# Patient Record
Sex: Male | Born: 1960 | Race: White | Hispanic: No | Marital: Married | Smoking: Former smoker
Health system: Southern US, Community
[De-identification: ages and names within clinical notes are randomized; demographics above are authoritative.]

## PROBLEM LIST (undated history)

## (undated) DIAGNOSIS — C61 Malignant neoplasm of prostate: Secondary | ICD-10-CM

## (undated) DIAGNOSIS — I10 Essential (primary) hypertension: Secondary | ICD-10-CM

## (undated) DIAGNOSIS — E119 Type 2 diabetes mellitus without complications: Secondary | ICD-10-CM

## (undated) HISTORY — DX: Essential (primary) hypertension: I10

## (undated) HISTORY — PX: PROSTATE BIOPSY: SHX241

## (undated) HISTORY — PX: TONSILLECTOMY: SUR1361

## (undated) HISTORY — DX: Type 2 diabetes mellitus without complications: E11.9

---

## 2012-06-30 ENCOUNTER — Ambulatory Visit (INDEPENDENT_AMBULATORY_CARE_PROVIDER_SITE_OTHER): Payer: BC Managed Care – PPO | Admitting: Family Medicine

## 2012-06-30 ENCOUNTER — Encounter: Payer: Self-pay | Admitting: Family Medicine

## 2012-06-30 VITALS — BP 130/80 | HR 100 | Temp 98.1°F | Resp 12 | Ht 70.0 in | Wt 235.0 lb

## 2012-06-30 DIAGNOSIS — K8020 Calculus of gallbladder without cholecystitis without obstruction: Secondary | ICD-10-CM

## 2012-06-30 DIAGNOSIS — R739 Hyperglycemia, unspecified: Secondary | ICD-10-CM | POA: Insufficient documentation

## 2012-06-30 DIAGNOSIS — R7309 Other abnormal glucose: Secondary | ICD-10-CM

## 2012-06-30 DIAGNOSIS — I10 Essential (primary) hypertension: Secondary | ICD-10-CM

## 2012-06-30 NOTE — Progress Notes (Signed)
  Subjective:    Patient ID: [name], male    DOB: March 28, 1961, 51 y.o.   MRN: [id number]  HPI  Patient is seen to establish care. History of hypertension. He just moved from [place]. Tour manager. Family will be moving out here couple weeks. Hypertension treated with Benicar HCTZ. He's also on another medication which he thinks is amlodipine but he cannot confirm. He has history of asymptomatic gallstones. No other chronic medical problems. Questionable history of previous hyperglycemia/prediabetes which apparently improved with weight loss. He's gained some weight back in recent months. No consistent exercise. No prior surgeries.  Family history significant for hyperlipidemia and hypertension both parents. Mother had type 2 diabetes and congestive heart failure in her 44s. No known coronary artery disease.  Patient is nonsmoker. Uses oral chewing tobacco. Usually one to 2 alcoholic beverages per day. Last tetanus unknown. Colonoscopy screening 2012 -reportedly had one benign polyp.   Review of Systems  Constitutional: Negative for fatigue.  Eyes: Negative for visual disturbance.  Respiratory: Negative for cough, chest tightness and shortness of breath.   Cardiovascular: Negative for chest pain, palpitations and leg swelling.  Neurological: Negative for dizziness, syncope, weakness, light-headedness and headaches.       Objective:   Physical Exam  Constitutional: He appears well-developed and well-nourished. No distress.  Neck: Neck supple. No thyromegaly present.  Cardiovascular: Normal rate and regular rhythm.   Pulmonary/Chest: Effort normal and breath sounds normal. No respiratory distress. He has no wheezes. He has no rales.  Musculoskeletal: He exhibits no edema.  Lymphadenopathy:    He has no cervical adenopathy.          Assessment & Plan:  #1 hypertension. Stable. Continue current medications. Confirm medications.  #2 reported history of hyperglycemia. Needs  followup labs and physical. We'll schedule. Discussed weight loss strategies  #3 health maintenance. Flu vaccine offered and declined. Patient will confirm last tetanus

## 2012-06-30 NOTE — Patient Instructions (Addendum)
Schedule complete physical. 

## 2012-07-16 ENCOUNTER — Other Ambulatory Visit: Payer: Self-pay | Admitting: Family Medicine

## 2012-07-16 MED ORDER — AMLODIPINE BESYLATE 10 MG PO TABS: 10.0000 mg | ORAL_TABLET | Freq: Every day | ORAL | Status: DC

## 2012-07-16 NOTE — Telephone Encounter (Signed)
Pt needs script ( he did not bring on initial visit) filled. Pt was instructed to call when he needed. The medicine is AMLODIPINE 10MG , 1 / day.  [facility]: [facility] on [address]

## 2012-07-22 ENCOUNTER — Other Ambulatory Visit: Payer: Self-pay | Admitting: *Deleted

## 2012-07-22 MED ORDER — OLMESARTAN MEDOXOMIL-HCTZ 40-25 MG PO TABS: 1.0000 | ORAL_TABLET | Freq: Every day | ORAL | Status: DC

## 2012-08-01 ENCOUNTER — Other Ambulatory Visit: Payer: Self-pay | Admitting: *Deleted

## 2012-08-01 MED ORDER — OLMESARTAN MEDOXOMIL-HCTZ 40-25 MG PO TABS: 1.0000 | ORAL_TABLET | Freq: Every day | ORAL | Status: DC

## 2012-08-27 DIAGNOSIS — E119 Type 2 diabetes mellitus without complications: Secondary | ICD-10-CM

## 2012-08-27 HISTORY — DX: Type 2 diabetes mellitus without complications: E11.9

## 2012-09-01 ENCOUNTER — Telehealth: Payer: Self-pay | Admitting: Family Medicine

## 2012-09-01 NOTE — Telephone Encounter (Signed)
RN attempted to contact patient, left a voice mail.   ° °

## 2012-09-04 ENCOUNTER — Ambulatory Visit (INDEPENDENT_AMBULATORY_CARE_PROVIDER_SITE_OTHER): Payer: BC Managed Care – PPO | Admitting: Family Medicine

## 2012-09-04 ENCOUNTER — Encounter: Payer: Self-pay | Admitting: Family Medicine

## 2012-09-04 VITALS — BP 122/82 | Temp 98.8°F | Wt 227.0 lb

## 2012-09-04 DIAGNOSIS — H538 Other visual disturbances: Secondary | ICD-10-CM

## 2012-09-04 DIAGNOSIS — I10 Essential (primary) hypertension: Secondary | ICD-10-CM

## 2012-09-04 DIAGNOSIS — E119 Type 2 diabetes mellitus without complications: Secondary | ICD-10-CM

## 2012-09-04 DIAGNOSIS — E1165 Type 2 diabetes mellitus with hyperglycemia: Secondary | ICD-10-CM | POA: Insufficient documentation

## 2012-09-04 LAB — BASIC METABOLIC PANEL
CO2: 27 mEq/L (ref 19–32)
Chloride: 92 mEq/L — ABNORMAL LOW (ref 96–112)
Sodium: 130 mEq/L — ABNORMAL LOW (ref 135–145)

## 2012-09-04 LAB — LIPID PANEL
Cholesterol: 286 mg/dL — ABNORMAL HIGH (ref 0–200)
HDL: 20 mg/dL — ABNORMAL LOW (ref >39.00)
Total CHOL/HDL Ratio: 14
Triglycerides: 1850 mg/dL — ABNORMAL HIGH (ref 0.0–149.0)

## 2012-09-04 LAB — HEMOGLOBIN A1C: Hgb A1c MFr Bld: 9.1 % — ABNORMAL HIGH (ref 4.6–6.5)

## 2012-09-04 LAB — HEPATIC FUNCTION PANEL
ALT: 30 U/L (ref 0–53)
Albumin: 4.4 g/dL (ref 3.5–5.2)
Total Bilirubin: 1.4 mg/dL — ABNORMAL HIGH (ref 0.3–1.2)

## 2012-09-04 LAB — GLUCOSE, POCT (MANUAL RESULT ENTRY): POC Glucose: 352 mg/dl — AB (ref 70–99)

## 2012-09-04 MED ORDER — METFORMIN HCL 500 MG PO TABS: 500.0000 mg | ORAL_TABLET | Freq: Two times a day (BID) | ORAL | Status: DC

## 2012-09-04 NOTE — Progress Notes (Signed)
  Subjective:    Patient ID: [name], male    DOB: Sep 04, 1960, 52 y.o.   MRN: [id number]  HPI  Progressive symptoms of urine frequency, increased thirst, blurred vision bilaterally, and some weight loss over the past couple of weeks. Patient has prior history of prediabetes. He states in the past this was controlled with weight loss. He had some increased job stress and other stressors recently. He has never monitored his blood sugars. He is 8 pound weight loss since last visit.  Patient has hypertension treated with amlodipine and Benicar HCTZ. Blood pressures have generally been well controlled. Patient denies recent chest pains. No dizziness. No dyspnea.  Past Medical History  Diagnosis Date  . Hypertension    No past surgical history on file.  reports that he quit smoking about 28 years ago. His smoking use included Cigarettes. He has a 10 pack-year smoking history. His smokeless tobacco use includes Chew. His alcohol and drug histories not on file. family history includes Aneurysm in his father; Diabetes in his mother; Heart disease (age of onset:67) in his mother; Hyperlipidemia in his father; and Hypertension in his father. No Known Allergies   Review of Systems  Constitutional: Positive for unexpected weight change. Negative for appetite change.  Respiratory: Negative for cough and shortness of breath.   Cardiovascular: Negative for chest pain.  Gastrointestinal: Negative for abdominal pain.  Genitourinary: Positive for frequency.       Objective:   Physical Exam  Constitutional: He appears well-developed and well-nourished.  Cardiovascular: Normal rate and regular rhythm.   Pulmonary/Chest: Effort normal and breath sounds normal. No respiratory distress. He has no wheezes. He has no rales.  Musculoskeletal: He exhibits no edema.          Assessment & Plan:  Type 2 diabetes- New-onset. Fasting blood sugar today 352. In this patient with prior history of Glucose  intolerance we've recommended going ahead and starting treatment given classic symptoms and magnitude of elevated blood sugar today. Home glucose monitor given. We've recommended diabetic education and he wishes to wait until followup visit in 2 weeks. Start metformin 500 mg twice daily. Dietary factors discussed.  Get some baseline labs with lipid panel, basic metabolic panel, hepatic panel, and hemoglobin A1c  Hypertension. Adequately controlled

## 2012-09-04 NOTE — Patient Instructions (Signed)

## 2012-09-09 ENCOUNTER — Other Ambulatory Visit: Payer: Self-pay | Admitting: *Deleted

## 2012-09-09 DIAGNOSIS — E785 Hyperlipidemia, unspecified: Secondary | ICD-10-CM

## 2012-09-09 MED ORDER — FENOFIBRATE 145 MG PO TABS: 145.0000 mg | ORAL_TABLET | Freq: Every day | ORAL | Status: DC

## 2012-09-09 NOTE — Progress Notes (Signed)
Quick Note:  Pt informed on personally identified VM, will also mail labs to pt home with instructions ______

## 2012-09-18 ENCOUNTER — Ambulatory Visit (INDEPENDENT_AMBULATORY_CARE_PROVIDER_SITE_OTHER): Payer: BC Managed Care – PPO | Admitting: Family Medicine

## 2012-09-18 ENCOUNTER — Encounter: Payer: Self-pay | Admitting: Family Medicine

## 2012-09-18 VITALS — BP 98/68 | Temp 98.6°F | Wt 226.0 lb

## 2012-09-18 DIAGNOSIS — E785 Hyperlipidemia, unspecified: Secondary | ICD-10-CM

## 2012-09-18 DIAGNOSIS — I10 Essential (primary) hypertension: Secondary | ICD-10-CM

## 2012-09-18 DIAGNOSIS — E119 Type 2 diabetes mellitus without complications: Secondary | ICD-10-CM

## 2012-09-18 DIAGNOSIS — E871 Hypo-osmolality and hyponatremia: Secondary | ICD-10-CM

## 2012-09-18 MED ORDER — METFORMIN HCL 500 MG PO TABS: ORAL_TABLET | ORAL | Status: DC

## 2012-09-18 NOTE — Progress Notes (Signed)
  Subjective:    Patient ID: [name], male    DOB: May 18, 1961, 52 y.o.   MRN: [id number]  HPI Followup newly diagnosed type 2 diabetes. Patient presented with classic symptoms of increased urinary frequency, thirst, and weight loss. We started metformin 500 mg twice daily. Blurred vision has resolved. Thirst and urine frequency are greatly improved. Weight has stabilized. Blood sugars fasting have been ranging still high around 250. 352 2 weeks ago before starting medication.  Blood pressure been stable. No orthostasis. Recent lab significant for A1c 9.1% prior starting treatment and severely elevated triglycerides of >1800. We started fenofibrate 145 mg daily which he is tolerating well with no side effects.  No abdominal pain. No nausea or vomiting. No history of pancreatitis. Overall feels improved  Past Medical History  Diagnosis Date  . Hypertension   . Diabetes mellitus without complication 1/14    type 2   No past surgical history on file.  reports that he quit smoking about 28 years ago. His smoking use included Cigarettes. He has a 10 pack-year smoking history. His smokeless tobacco use includes Chew. His alcohol and drug histories not on file. family history includes Aneurysm in his father; Diabetes in his mother; Heart disease (age of onset:67) in his mother; Hyperlipidemia in his father; and Hypertension in his father. No Known Allergies    Review of Systems  Constitutional: Negative for fatigue.  Eyes: Negative for visual disturbance.  Respiratory: Negative for cough, chest tightness and shortness of breath.   Cardiovascular: Negative for chest pain, palpitations and leg swelling.  Gastrointestinal: Negative for nausea and vomiting.  Neurological: Negative for dizziness, syncope, weakness, light-headedness and headaches.       Objective:   Physical Exam  Constitutional: He appears well-developed and well-nourished.  HENT:  Mouth/Throat: Oropharynx is clear and  moist.  Neck: Neck supple.  Cardiovascular: Normal rate and regular rhythm.   Pulmonary/Chest: Effort normal and breath sounds normal. No respiratory distress. He has no wheezes. He has no rales.  Musculoskeletal: He exhibits no edema.          Assessment & Plan:  #1 type 2 diabetes. Improved control but still poor control by home readings. Titrate metformin to 1000 mg twice daily. Continue to monitor at home. Pt agrees to setting up appt with diabetes educator. #2 severe dyslipidemia. Order future labs for fasting lipid panel in approximately 6 weeks. Tolerating Fenofibrate without side effects. #3 hypertension. Well controlled

## 2012-09-18 NOTE — Patient Instructions (Signed)
Hypertriglyceridemia  Diet for High blood levels of Triglycerides Most fats in food are triglycerides. Triglycerides in your blood are stored as fat in your body. High levels of triglycerides in your blood may put you at a greater risk for heart disease and stroke.  Normal triglyceride levels are less than 150 mg/dL. Borderline high levels are 150-199 mg/dl. High levels are 200 - 499 mg/dL, and very high triglyceride levels are greater than 500 mg/dL. The decision to treat high triglycerides is generally based on the level. For people with borderline or high triglyceride levels, treatment includes weight loss and exercise. Drugs are recommended for people with very high triglyceride levels. Many people who need treatment for high triglyceride levels have metabolic syndrome. This syndrome is a collection of disorders that often include: insulin resistance, high blood pressure, blood clotting problems, high cholesterol and triglycerides. TESTING PROCEDURE FOR TRIGLYCERIDES  You should not eat 4 hours before getting your triglycerides measured. The normal range of triglycerides is between 10 and 250 milligrams per deciliter (mg/dl). Some people may have extreme levels (1000 or above), but your triglyceride level may be too high if it is above 150 mg/dl, depending on what other risk factors you have for heart disease.  People with high blood triglycerides may also have high blood cholesterol levels. If you have high blood cholesterol as well as high blood triglycerides, your risk for heart disease is probably greater than if you only had high triglycerides. High blood cholesterol is one of the main risk factors for heart disease. CHANGING YOUR DIET  Your weight can affect your blood triglyceride level. If you are more than 20% above your ideal body weight, you may be able to lower your blood triglycerides by losing weight. Eating less and exercising regularly is the best way to combat this. Fat provides more  calories than any other food. The best way to lose weight is to eat less fat. Only 30% of your total calories should come from fat. Less than 7% of your diet should come from saturated fat. A diet low in fat and saturated fat is the same as a diet to decrease blood cholesterol. By eating a diet lower in fat, you may lose weight, lower your blood cholesterol, and lower your blood triglyceride level.  Eating a diet low in fat, especially saturated fat, may also help you lower your blood triglyceride level. Ask your dietitian to help you figure how much fat you can eat based on the number of calories your caregiver has prescribed for you.  Exercise, in addition to helping with weight loss may also help lower triglyceride levels.   Alcohol can increase blood triglycerides. You may need to stop drinking alcoholic beverages.  Too much carbohydrate in your diet may also increase your blood triglycerides. Some complex carbohydrates are necessary in your diet. These may include bread, rice, potatoes, other starchy vegetables and cereals.  Reduce "simple" carbohydrates. These may include pure sugars, candy, honey, and jelly without losing other nutrients. If you have the kind of high blood triglycerides that is affected by the amount of carbohydrates in your diet, you will need to eat less sugar and less high-sugar foods. Your caregiver can help you with this.  Adding 2-4 grams of fish oil (EPA+ DHA) may also help lower triglycerides. Speak with your caregiver before adding any supplements to your regimen. Following the Diet  Maintain your ideal weight. Your caregivers can help you with a diet. Generally, eating less food and getting more   exercise will help you lose weight. Joining a weight control group may also help. Ask your caregivers for a good weight control group in your area.  Eat low-fat foods instead of high-fat foods. This can help you lose weight too.  These foods are lower in fat. Eat MORE of these:    Dried beans, peas, and lentils.  Egg whites.  Low-fat cottage cheese.  Fish.  Lean cuts of meat, such as round, sirloin, rump, and flank (cut extra fat off meat you fix).  Whole grain breads, cereals and pasta.  Skim and nonfat dry milk.  Low-fat yogurt.  Poultry without the skin.  Cheese made with skim or part-skim milk, such as mozzarella, parmesan, farmers', ricotta, or pot cheese. These are higher fat foods. Eat LESS of these:   Whole milk and foods made from whole milk, such as American, blue, cheddar, monterey jack, and swiss cheese  High-fat meats, such as luncheon meats, sausages, knockwurst, bratwurst, hot dogs, ribs, corned beef, ground pork, and regular ground beef.  Fried foods. Limit saturated fats in your diet. Substituting unsaturated fat for saturated fat may decrease your blood triglyceride level. You will need to read package labels to know which products contain saturated fats.  These foods are high in saturated fat. Eat LESS of these:   Fried pork skins.  Whole milk.  Skin and fat from poultry.  Palm oil.  Butter.  Shortening.  Cream cheese.  Bacon.  Margarines and baked goods made from listed oils.  Vegetable shortenings.  Chitterlings.  Fat from meats.  Coconut oil.  Palm kernel oil.  Lard.  Cream.  Sour cream.  Fatback.  Coffee whiteners and non-dairy creamers made with these oils.  Cheese made from whole milk. Use unsaturated fats (both polyunsaturated and monounsaturated) moderately. Remember, even though unsaturated fats are better than saturated fats; you still want a diet low in total fat.  These foods are high in unsaturated fat:   Canola oil.  Sunflower oil.  Mayonnaise.  Almonds.  Peanuts.  Pine nuts.  Margarines made with these oils.  Safflower oil.  Olive oil.  Avocados.  Cashews.  Peanut butter.  Sunflower seeds.  Soybean oil.  Peanut  oil.  Olives.  Pecans.  Walnuts.  Pumpkin seeds. Avoid sugar and other high-sugar foods. This will decrease carbohydrates without decreasing other nutrients. Sugar in your food goes rapidly to your blood. When there is excess sugar in your blood, your liver may use it to make more triglycerides. Sugar also contains calories without other important nutrients.  Eat LESS of these:   Sugar, brown sugar, powdered sugar, jam, jelly, preserves, honey, syrup, molasses, pies, candy, cakes, cookies, frosting, pastries, colas, soft drinks, punches, fruit drinks, and regular gelatin.  Avoid alcohol. Alcohol, even more than sugar, may increase blood triglycerides. In addition, alcohol is high in calories and low in nutrients. Ask for sparkling water, or a diet soft drink instead of an alcoholic beverage. Suggestions for planning and preparing meals   Bake, broil, grill or roast meats instead of frying.  Remove fat from meats and skin from poultry before cooking.  Add spices, herbs, lemon juice or vinegar to vegetables instead of salt, rich sauces or gravies.  Use a non-stick skillet without fat or use no-stick sprays.  Cool and refrigerate stews and broth. Then remove the hardened fat floating on the surface before serving.  Refrigerate meat drippings and skim off fat to make low-fat gravies.  Serve more fish.  Use less butter,   margarine and other high-fat spreads on bread or vegetables.  Use skim or reconstituted non-fat dry milk for cooking.  Cook with low-fat cheeses.  Substitute low-fat yogurt or cottage cheese for all or part of the sour cream in recipes for sauces, dips or congealed salads.  Use half yogurt/half mayonnaise in salad recipes.  Substitute evaporated skim milk for cream. Evaporated skim milk or reconstituted non-fat dry milk can be whipped and substituted for whipped cream in certain recipes.  Choose fresh fruits for dessert instead of high-fat foods such as pies or  cakes. Fruits are naturally low in fat. When Dining Out   Order low-fat appetizers such as fruit or vegetable juice, pasta with vegetables or tomato sauce.  Select clear, rather than cream soups.  Ask that dressings and gravies be served on the side. Then use less of them.  Order foods that are baked, broiled, poached, steamed, stir-fried, or roasted.  Ask for margarine instead of butter, and use only a small amount.  Drink sparkling water, unsweetened tea or coffee, or diet soft drinks instead of alcohol or other sweet beverages. QUESTIONS AND ANSWERS ABOUT OTHER FATS IN THE BLOOD: SATURATED FAT, TRANS FAT, AND CHOLESTEROL What is trans fat? Trans fat is a type of fat that is formed when vegetable oil is hardened through a process called hydrogenation. This process helps makes foods more solid, gives them shape, and prolongs their shelf life. Trans fats are also called hydrogenated or partially hydrogenated oils.  What do saturated fat, trans fat, and cholesterol in foods have to do with heart disease? Saturated fat, trans fat, and cholesterol in the diet all raise the level of LDL "bad" cholesterol in the blood. The higher the LDL cholesterol, the greater the risk for coronary heart disease (CHD). Saturated fat and trans fat raise LDL similarly.  What foods contain saturated fat, trans fat, and cholesterol? High amounts of saturated fat are found in animal products, such as fatty cuts of meat, chicken skin, and full-fat dairy products like butter, whole milk, cream, and cheese, and in tropical vegetable oils such as palm, palm kernel, and coconut oil. Trans fat is found in some of the same foods as saturated fat, such as vegetable shortening, some margarines (especially hard or stick margarine), crackers, cookies, baked goods, fried foods, salad dressings, and other processed foods made with partially hydrogenated vegetable oils. Small amounts of trans fat also occur naturally in some animal  products, such as milk products, beef, and lamb. Foods high in cholesterol include liver, other organ meats, egg yolks, shrimp, and full-fat dairy products. How can I use the new food label to make heart-healthy food choices? Check the Nutrition Facts panel of the food label. Choose foods lower in saturated fat, trans fat, and cholesterol. For saturated fat and cholesterol, you can also use the Percent Daily Value (%DV): 5% DV or less is low, and 20% DV or more is high. (There is no %DV for trans fat.) Use the Nutrition Facts panel to choose foods low in saturated fat and cholesterol, and if the trans fat is not listed, read the ingredients and limit products that list shortening or hydrogenated or partially hydrogenated vegetable oil, which tend to be high in trans fat. POINTS TO REMEMBER:   Discuss your risk for heart disease with your caregivers, and take steps to reduce risk factors.  Change your diet. Choose foods that are low in saturated fat, trans fat, and cholesterol.  Add exercise to your daily routine if   it is not already being done. Participate in physical activity of moderate intensity, like brisk walking, for at least 30 minutes on most, and preferably all days of the week. No time? Break the 30 minutes into three, 10-minute segments during the day.  Stop smoking. If you do smoke, contact your caregiver to discuss ways in which they can help you quit.  Do not use street drugs.  Maintain a normal weight.  Maintain a healthy blood pressure.  Keep up with your blood work for checking the fats in your blood as directed by your caregiver. Document Released: 05/31/2004 Document Revised: 02/12/2012 Document Reviewed: 12/27/2008 [facility] Patient Information 2013 [place], [place].  Go ahead and increase to metformin 500 mg to 2 tablets twice daily

## 2012-11-06 ENCOUNTER — Other Ambulatory Visit: Payer: BC Managed Care – PPO

## 2012-11-13 ENCOUNTER — Ambulatory Visit: Payer: BC Managed Care – PPO | Admitting: Family Medicine

## 2012-11-20 ENCOUNTER — Other Ambulatory Visit: Payer: BC Managed Care – PPO

## 2012-11-25 ENCOUNTER — Other Ambulatory Visit (INDEPENDENT_AMBULATORY_CARE_PROVIDER_SITE_OTHER): Payer: BC Managed Care – PPO

## 2012-11-25 DIAGNOSIS — E785 Hyperlipidemia, unspecified: Secondary | ICD-10-CM

## 2012-11-25 DIAGNOSIS — E871 Hypo-osmolality and hyponatremia: Secondary | ICD-10-CM

## 2012-11-25 LAB — LIPID PANEL
Cholesterol: 152 mg/dL (ref 0–200)
HDL: 36 mg/dL — ABNORMAL LOW (ref >39.00)
LDL Cholesterol: 97 mg/dL (ref 0–99)
Total CHOL/HDL Ratio: 4
Triglycerides: 95 mg/dL (ref 0.0–149.0)
VLDL: 19 mg/dL (ref 0.0–40.0)

## 2012-11-25 LAB — HEPATIC FUNCTION PANEL
AST: 24 U/L (ref 0–37)
Albumin: 4.4 g/dL (ref 3.5–5.2)
Alkaline Phosphatase: 27 U/L — ABNORMAL LOW (ref 39–117)
Total Bilirubin: 0.8 mg/dL (ref 0.3–1.2)

## 2012-11-25 LAB — BASIC METABOLIC PANEL
Calcium: 9.5 mg/dL (ref 8.4–10.5)
GFR: 68.32 mL/min (ref >60.00)
Glucose, Bld: 127 mg/dL — ABNORMAL HIGH (ref 70–99)
Potassium: 4.1 mEq/L (ref 3.5–5.1)
Sodium: 136 mEq/L (ref 135–145)

## 2012-11-26 LAB — HEMOGLOBIN A1C: Hgb A1c MFr Bld: 5.9 % (ref 4.6–6.5)

## 2012-11-27 ENCOUNTER — Ambulatory Visit (INDEPENDENT_AMBULATORY_CARE_PROVIDER_SITE_OTHER): Payer: BC Managed Care – PPO | Admitting: Family Medicine

## 2012-11-27 ENCOUNTER — Encounter: Payer: Self-pay | Admitting: Family Medicine

## 2012-11-27 VITALS — BP 102/72 | Temp 98.4°F | Wt 226.0 lb

## 2012-11-27 DIAGNOSIS — E785 Hyperlipidemia, unspecified: Secondary | ICD-10-CM

## 2012-11-27 DIAGNOSIS — E119 Type 2 diabetes mellitus without complications: Secondary | ICD-10-CM

## 2012-11-27 DIAGNOSIS — Z0000 Encounter for general adult medical examination without abnormal findings: Secondary | ICD-10-CM

## 2012-11-27 NOTE — Progress Notes (Signed)
Quick Note:  Pt here for OV today ______

## 2012-11-27 NOTE — Patient Instructions (Signed)
Consider complete physical at follow up in 6 months.

## 2012-11-27 NOTE — Progress Notes (Signed)
  Subjective:    Patient ID: [name], male    DOB: December 15, 1960, 52 y.o.   MRN: [id number]  HPI Patient here for followup type 2 diabetes and dyslipidemia He presented with A1c 9.1% and classic symptoms of diabetes We started metformin currently taking 1000 mg twice daily. A1c 5.9% and symptoms of hyperglycemia fairly resolved. .Severe elevation of triglycerides 1850 which came down to 95 with diabetes control and fenofibrate Cholesterol improved from 286 to 152 Patient feels much better overall. Still not exercising. He made dramatic dietary changes  Past Medical History  Diagnosis Date  . Hypertension   . Diabetes mellitus without complication 1/14    type 2   No past surgical history on file.  reports that he quit smoking about 28 years ago. His smoking use included Cigarettes. He has a 10 pack-year smoking history. His smokeless tobacco use includes Chew. His alcohol and drug histories are not on file. family history includes Aneurysm in his father; Diabetes in his mother; Heart disease (age of onset: 30) in his mother; Hyperlipidemia in his father; and Hypertension in his father. No Known Allergies    Review of Systems  Constitutional: Negative for fatigue.  Eyes: Negative for visual disturbance.  Respiratory: Negative for cough, chest tightness and shortness of breath.   Cardiovascular: Negative for chest pain, palpitations and leg swelling.  Neurological: Negative for dizziness, syncope, weakness, light-headedness and headaches.       Objective:   Physical Exam  Constitutional: He appears well-developed and well-nourished.  Cardiovascular: Normal rate and regular rhythm.  Exam reveals no gallop.   Pulmonary/Chest: Effort normal and breath sounds normal. No respiratory distress. He has no wheezes. He has no rales.  Musculoskeletal: He exhibits no edema.          Assessment & Plan:  #1 type 2 diabetes. Improved control. Recheck A1c 6 months. Establish more  consistent exercise. May be able to reduce metformin and that point if still well controlled #2 dyslipidemia. Tremendous improvement in lipids with blood sugar control and fenofibrate. Schedule complete physical and repeat labs including A1c and lipids at that time

## 2013-01-26 ENCOUNTER — Other Ambulatory Visit: Payer: Self-pay | Admitting: Family Medicine

## 2013-05-17 ENCOUNTER — Other Ambulatory Visit: Payer: Self-pay | Admitting: Family Medicine

## 2013-05-29 ENCOUNTER — Ambulatory Visit: Payer: BC Managed Care – PPO | Admitting: Family Medicine

## 2013-06-24 ENCOUNTER — Other Ambulatory Visit: Payer: Self-pay | Admitting: Family Medicine

## 2013-06-29 ENCOUNTER — Ambulatory Visit (INDEPENDENT_AMBULATORY_CARE_PROVIDER_SITE_OTHER): Payer: BC Managed Care – PPO | Admitting: Family Medicine

## 2013-06-29 ENCOUNTER — Encounter: Payer: Self-pay | Admitting: Family Medicine

## 2013-06-29 VITALS — BP 120/68 | HR 89 | Temp 97.7°F | Wt 226.0 lb

## 2013-06-29 DIAGNOSIS — M545 Low back pain: Secondary | ICD-10-CM

## 2013-06-29 DIAGNOSIS — E785 Hyperlipidemia, unspecified: Secondary | ICD-10-CM

## 2013-06-29 DIAGNOSIS — E119 Type 2 diabetes mellitus without complications: Secondary | ICD-10-CM

## 2013-06-29 DIAGNOSIS — I10 Essential (primary) hypertension: Secondary | ICD-10-CM

## 2013-06-29 LAB — LIPID PANEL
Cholesterol: 159 mg/dL (ref 0–200)
Triglycerides: 224 mg/dL — ABNORMAL HIGH (ref 0.0–149.0)

## 2013-06-29 LAB — HEMOGLOBIN A1C: Hgb A1c MFr Bld: 6 % (ref 4.6–6.5)

## 2013-06-29 NOTE — Progress Notes (Signed)
  Subjective:    Patient ID: [name], male    DOB: 1961-02-08, 52 y.o.   MRN: [id number]  HPI  Followup regarding type 2 diabetes, hypertension, and dyslipidemia Patient had presented here with severe elevated triglycerides and hemoglobin A1c 9.1%. We started metformin along with fenofibrate. Dramatic improvement in blood sugars and lipids. Medications reviewed and compliant with all. Blood pressure well controlled. No recent dizziness. No symptoms of polyuria or polydipsia.  He has had some chronic right lumbar back pain for quite some time. No radiculopathy symptoms. No weakness or numbness. No appetite or weight changes. Pain is worse with position change.  Past history of alcohol abuse. Recent brief relapse. He is not interested in counseling yet at this point.  Past Medical History  Diagnosis Date  . Hypertension   . Diabetes mellitus without complication 1/14    type 2   No past surgical history on file.  reports that he quit smoking about 29 years ago. His smoking use included Cigarettes. He has a 10 pack-year smoking history. His smokeless tobacco use includes Chew. His alcohol and drug histories are not on file. family history includes Aneurysm in his father; Diabetes in his mother; Heart disease (age of onset: 59) in his mother; Hyperlipidemia in his father; Hypertension in his father. No Known Allergies    Review of Systems  Constitutional: Negative for fatigue.  Eyes: Negative for visual disturbance.  Respiratory: Negative for cough, chest tightness and shortness of breath.   Cardiovascular: Negative for chest pain, palpitations and leg swelling.  Musculoskeletal: Positive for back pain.  Neurological: Negative for dizziness, syncope, weakness, light-headedness, numbness and headaches.       Objective:   Physical Exam  Constitutional: He appears well-developed and well-nourished.  Cardiovascular: Normal rate and regular rhythm.   Pulmonary/Chest: Effort normal  and breath sounds normal. No respiratory distress. He has no wheezes. He has no rales.  Musculoskeletal: He exhibits no edema.  Straight leg raises are negative bilaterally  Neurological:  Deep tendon reflexes are symmetric knee and ankle bilaterally. Full-strength lower extremities          Assessment & Plan:  #1 hypertension. Adequately controlled #2 type 2 diabetes. Recheck A1c. Set up eye exam #3 dyslipidemia. Repeat lipids #4 lumbar back pain. Nonfocal exam. We discussed possible stretches. Consider physical therapy at this point he is not interested #5 health maintenance. Recommended flu vaccine and patient declines

## 2013-08-16 ENCOUNTER — Other Ambulatory Visit: Payer: Self-pay | Admitting: Family Medicine

## 2013-08-28 ENCOUNTER — Other Ambulatory Visit: Payer: Self-pay | Admitting: Family Medicine

## 2013-09-29 ENCOUNTER — Other Ambulatory Visit: Payer: Self-pay | Admitting: Family Medicine

## 2013-12-28 ENCOUNTER — Ambulatory Visit: Payer: BC Managed Care – PPO | Admitting: Family Medicine

## 2014-01-06 ENCOUNTER — Other Ambulatory Visit: Payer: Self-pay | Admitting: Family Medicine

## 2014-01-23 ENCOUNTER — Other Ambulatory Visit: Payer: Self-pay | Admitting: Family Medicine

## 2014-02-03 ENCOUNTER — Ambulatory Visit: Payer: BC Managed Care – PPO | Admitting: Family Medicine

## 2014-03-01 ENCOUNTER — Ambulatory Visit (INDEPENDENT_AMBULATORY_CARE_PROVIDER_SITE_OTHER): Payer: BC Managed Care – PPO | Admitting: Family Medicine

## 2014-03-01 ENCOUNTER — Encounter: Payer: Self-pay | Admitting: Family Medicine

## 2014-03-01 VITALS — BP 128/74 | HR 92 | Temp 98.4°F | Wt 232.0 lb

## 2014-03-01 DIAGNOSIS — Z125 Encounter for screening for malignant neoplasm of prostate: Secondary | ICD-10-CM

## 2014-03-01 DIAGNOSIS — E119 Type 2 diabetes mellitus without complications: Secondary | ICD-10-CM

## 2014-03-01 DIAGNOSIS — E669 Obesity, unspecified: Secondary | ICD-10-CM | POA: Insufficient documentation

## 2014-03-01 DIAGNOSIS — I10 Essential (primary) hypertension: Secondary | ICD-10-CM

## 2014-03-01 DIAGNOSIS — E785 Hyperlipidemia, unspecified: Secondary | ICD-10-CM

## 2014-03-01 LAB — HM DIABETES FOOT EXAM: HM DIABETIC FOOT EXAM: NORMAL

## 2014-03-01 LAB — HM DIABETES EYE EXAM

## 2014-03-01 MED ORDER — GLUCOSE BLOOD VI STRP: ORAL_STRIP | Status: DC

## 2014-03-01 MED ORDER — FREESTYLE LANCETS MISC: Status: AC

## 2014-03-01 NOTE — Progress Notes (Signed)
Pre visit review using our clinic review tool, if applicable. No additional management support is needed unless otherwise documented below in the visit note. 

## 2014-03-01 NOTE — Progress Notes (Signed)
   Subjective:    Patient ID: [name], male    DOB: Feb 28, 1961, 53 y.o.   MRN: [id number]  HPI Medical followup. Patient has history of type 2 diabetes, hypertension, hyperlipidemia. Medications reviewed. Compliant with all. No side effects. Not monitoring blood sugars. No symptoms of polydipsia or polyuria. Last A1c 6.0%. Patient needs new glucose monitor. Denies recent chest pains. He is surprised that he has gained some weight since last visit as he has been more active physically.  Past Medical History  Diagnosis Date  . Hypertension   . Diabetes mellitus without complication 5/39    type 2   No past surgical history on file.  reports that he quit smoking about 29 years ago. His smoking use included Cigarettes. He has a 10 pack-year smoking history. His smokeless tobacco use includes Chew. His alcohol and drug histories are not on file. family history includes Aneurysm in his father; Diabetes in his mother; Heart disease (age of onset: 42) in his mother; Hyperlipidemia in his father; Hypertension in his father. No Known Allergies    Review of Systems  Constitutional: Negative for fatigue.  Eyes: Negative for visual disturbance.  Respiratory: Negative for cough, chest tightness and shortness of breath.   Cardiovascular: Negative for chest pain, palpitations and leg swelling.  Endocrine: Negative for polydipsia and polyuria.  Neurological: Negative for dizziness, syncope, weakness, light-headedness and headaches.       Objective:   Physical Exam  Constitutional: He is oriented to person, place, and time. He appears well-developed and well-nourished.  HENT:  Right Ear: External ear normal.  Left Ear: External ear normal.  Mouth/Throat: Oropharynx is clear and moist.  Eyes: Pupils are equal, round, and reactive to light.  Neck: Neck supple. No thyromegaly present.  Cardiovascular: Normal rate and regular rhythm.   Pulmonary/Chest: Effort normal and breath sounds normal. No  respiratory distress. He has no wheezes. He has no rales.  Musculoskeletal: He exhibits no edema.  Neurological: He is alert and oriented to person, place, and time.          Assessment & Plan:   #1 type 2 diabetes. History of good control.  Given new home monitor with test strips. Recommend yearly eye exam #2 hypertension which is well-controlled #3 dyslipidemia. Lipids improved last fall. Recheck in 6 months

## 2014-03-02 ENCOUNTER — Telehealth: Payer: Self-pay | Admitting: Family Medicine

## 2014-03-02 LAB — HEMOGLOBIN A1C: Hgb A1c MFr Bld: 6.5 % (ref 4.6–6.5)

## 2014-03-02 LAB — PSA: PSA: 62.99 ng/mL — ABNORMAL HIGH (ref 0.10–4.00)

## 2014-03-02 NOTE — Telephone Encounter (Signed)
Relevant patient education assigned to patient using Emmi. ° °

## 2014-03-08 ENCOUNTER — Telehealth: Payer: Self-pay | Admitting: Family Medicine

## 2014-03-08 ENCOUNTER — Other Ambulatory Visit: Payer: Self-pay

## 2014-03-08 ENCOUNTER — Other Ambulatory Visit: Payer: Self-pay | Admitting: Family Medicine

## 2014-03-08 DIAGNOSIS — R972 Elevated prostate specific antigen [PSA]: Secondary | ICD-10-CM

## 2014-03-08 MED ORDER — CIPROFLOXACIN HCL 500 MG PO TABS: 500.0000 mg | ORAL_TABLET | Freq: Two times a day (BID) | ORAL | Status: DC

## 2014-03-08 NOTE — Telephone Encounter (Signed)
Pt informed

## 2014-03-08 NOTE — Telephone Encounter (Signed)
Pt would his lab results

## 2014-03-31 ENCOUNTER — Other Ambulatory Visit (INDEPENDENT_AMBULATORY_CARE_PROVIDER_SITE_OTHER): Payer: BC Managed Care – PPO

## 2014-03-31 DIAGNOSIS — R972 Elevated prostate specific antigen [PSA]: Secondary | ICD-10-CM

## 2014-03-31 LAB — PSA: PSA: 68.68 ng/mL — AB (ref 0.10–4.00)

## 2014-04-05 ENCOUNTER — Ambulatory Visit (INDEPENDENT_AMBULATORY_CARE_PROVIDER_SITE_OTHER): Payer: BC Managed Care – PPO | Admitting: Family Medicine

## 2014-04-05 ENCOUNTER — Encounter: Payer: Self-pay | Admitting: Family Medicine

## 2014-04-05 VITALS — BP 130/80 | HR 90 | Wt 235.0 lb

## 2014-04-05 DIAGNOSIS — R972 Elevated prostate specific antigen [PSA]: Secondary | ICD-10-CM | POA: Insufficient documentation

## 2014-04-05 NOTE — Progress Notes (Signed)
   Subjective:    Patient ID: [name], male    DOB: 06-04-1961, 53 y.o.   MRN: [id number]  HPI Patient seen for recent elevated PSA. We obtained PSA of 62. He was not having any infectious or obstructive urinary symptoms. He was placed on ciprofloxacin which he took for 10 days. Repeat PSA last week 68. He has not sure if he had any prior PSA testing. No obstructive symptoms. No dysuria. Denies any previous urologic assessment. No family history of prostate cancer.  Other chronic problems include type 2 diabetes, hypertension, dyslipidemia. Blood sugars been well controlled with recent A1c 6.5%. Blood pressure well-controlled. Weight is stable  Past Medical History  Diagnosis Date  . Hypertension   . Diabetes mellitus without complication 6/94    type 2   No past surgical history on file.  reports that he quit smoking about 29 years ago. His smoking use included Cigarettes. He has a 10 pack-year smoking history. His smokeless tobacco use includes Chew. His alcohol and drug histories are not on file. family history includes Aneurysm in his father; Diabetes in his mother; Heart disease (age of onset: 55) in his mother; Hyperlipidemia in his father; Hypertension in his father. No Known Allergies    Review of Systems  Constitutional: Negative for appetite change and unexpected weight change.  Gastrointestinal: Negative for abdominal pain.  Genitourinary: Negative for dysuria, hematuria and decreased urine volume.       Objective:   Physical Exam  Constitutional: He appears well-developed and well-nourished.  Cardiovascular: Normal rate and regular rhythm.   No murmur heard. Pulmonary/Chest: Effort normal and breath sounds normal. No respiratory distress. He has no wheezes. He has no rales.  Genitourinary:  Left lobe of prostate is firm to palpation. There is no tenderness.          Assessment & Plan:  Elevated PSA. Patient needs to see urologist. Referral is made and  patient agrees to this plan.

## 2014-04-05 NOTE — Progress Notes (Signed)
Pre visit review using our clinic review tool, if applicable. No additional management support is needed unless otherwise documented below in the visit note. 

## 2014-04-05 NOTE — Patient Instructions (Signed)
Prostate-Specific Antigen The prostate-specific antigen (PSA) test is a blood test. It can be used to help diagnose prostate cancer in men whose physical exam results suggest the presence of prostate cancer. Some factors interfere with the results of the PSA. The factors listed below will either increase or decrease the PSA levels. They are:  Prescriptions used for male baldness.  Some herbs.  Active prostate infection.  Prior instrumentation or urinary catheterization.  Ejaculation up to 2 days prior to testing.  A noncancerous enlargement of the prostate.  Inflammation of the prostate.  Active urinary tract infection. If your test results are high (elevated), your health care provider will discuss the results with you. He or she will also let you know if more evaluation is needed. PREPARATION FOR TEST No preparation or fasting is necessary. NORMAL FINDINGS Less than 4 ng/mL or less than 37mcg/L (SI units) Ranges for normal findings may vary among different labs and hospitals. You should always check with your health care provider after having lab work or other tests done to discuss the meaning of your test results and whether your values are considered within normal limits. MEANING OF TEST  A normal value means prostate cancer is less likely. The chance of having prostate cancer increases if the value is between 4 ng/mL and 10 ng/mL. However, further testing will be needed. Values above 10 ng/mL suggest that there is a much higher chance of having prostate cancer (if the above situations that raise PSA are not present). Your health care provider will go over your test results with you and discuss the importance of this test. If this value is elevated, your health care provider may recommend further testing or evaluation. OBTAINING THE TEST RESULTS It is your responsibility to obtain your test results. Ask the lab or department performing the test when and how you will get your  results. Document Released: 09/15/2004 Document Revised: 08/18/2013 Document Reviewed: 03/21/2007 [facility] Patient Information 2015 [place], [place]. This information is not intended to replace advice given to you by your health care provider. Make sure you discuss any questions you have with your health care provider.

## 2014-04-07 ENCOUNTER — Other Ambulatory Visit: Payer: Self-pay | Admitting: Family Medicine

## 2014-04-07 DIAGNOSIS — R972 Elevated prostate specific antigen [PSA]: Secondary | ICD-10-CM

## 2014-04-23 ENCOUNTER — Other Ambulatory Visit (HOSPITAL_COMMUNITY): Payer: Self-pay | Admitting: Urology

## 2014-04-23 DIAGNOSIS — C61 Malignant neoplasm of prostate: Secondary | ICD-10-CM

## 2014-04-29 ENCOUNTER — Other Ambulatory Visit: Payer: Self-pay | Admitting: Family Medicine

## 2014-05-05 ENCOUNTER — Encounter (HOSPITAL_COMMUNITY)
Admission: RE | Admit: 2014-05-05 | Discharge: 2014-05-05 | Disposition: A | Discharge status: Home / Self Care | Payer: BC Managed Care – PPO | Source: Ambulatory Visit | Attending: Urology | Admitting: Urology

## 2014-05-05 ENCOUNTER — Encounter (HOSPITAL_COMMUNITY): Payer: Self-pay

## 2014-05-05 ENCOUNTER — Encounter (HOSPITAL_COMMUNITY)
Admission: RE | Admit: 2014-05-05 | Discharge: 2014-05-05 | Disposition: A | Discharge status: Home / Self Care | Payer: BC Managed Care – PPO | Source: Ambulatory Visit | Attending: General Practice | Admitting: General Practice

## 2014-05-05 DIAGNOSIS — C61 Malignant neoplasm of prostate: Secondary | ICD-10-CM | POA: Diagnosis not present

## 2014-05-05 MED ORDER — TECHNETIUM TC 99M MEDRONATE IV KIT
27.0000 | PACK | Freq: Once | INTRAVENOUS | Status: AC | PRN
  Administered 2014-05-05: 27 via INTRAVENOUS

## 2014-05-14 NOTE — Progress Notes (Signed)
GU Location of Tumor / Histology: Prostate   If Prostate Cancer, Gleason Score is (4 + 4=8) and PSA is (68)  [name] presented  months ago with signs/symptoms of: per [name], MD note on 04/05/2014 8:49 AM: HPI Patient seen for recent elevated PSA. We obtained PSA of 62. He was not having any infectious or obstructive urinary symptoms. He was placed on ciprofloxacin which he took for 10 days. Repeat PSA last week 68. He has not sure if he had any prior PSA testing. No obstructive symptoms. No dysuria. Denies any previous urologic assessment. No family history of prostate cancer. Left lobe of prostate is firm to palpation. There is no tenderness.   Biopsies of  (if applicable) revealed:   Past/Anticipated interventions by urology, if any: Negative for dysuria, hematuria and decreased urine volume  Past/Anticipated interventions by medical oncology, if any:   Weight changes, if any: negative for appetite change and unexpected weight change  Bowel/Bladder complaints, if any:   Nausea/Vomiting, if any:   Pain issues, if any:    SAFETY ISSUES:  Prior radiation? no  Pacemaker/ICD? no  Possible current pregnancy? no  Is the patient on methotrexate? no  Current Complaints / other details:

## 2014-05-17 ENCOUNTER — Ambulatory Visit
Admission: RE | Admit: 2014-05-17 | Discharge: 2014-05-17 | Disposition: A | Discharge status: Home / Self Care | Payer: BC Managed Care – PPO | Source: Ambulatory Visit | Attending: Radiation Oncology | Admitting: Radiation Oncology

## 2014-05-17 ENCOUNTER — Encounter: Payer: Self-pay | Admitting: Radiation Oncology

## 2014-05-17 VITALS — BP 135/69 | HR 92 | Temp 98.0°F | Resp 16 | Ht 69.0 in | Wt 235.0 lb

## 2014-05-17 DIAGNOSIS — I10 Essential (primary) hypertension: Secondary | ICD-10-CM | POA: Diagnosis not present

## 2014-05-17 DIAGNOSIS — Z510 Encounter for antineoplastic radiation therapy: Secondary | ICD-10-CM | POA: Insufficient documentation

## 2014-05-17 DIAGNOSIS — Z87891 Personal history of nicotine dependence: Secondary | ICD-10-CM | POA: Diagnosis not present

## 2014-05-17 DIAGNOSIS — C61 Malignant neoplasm of prostate: Secondary | ICD-10-CM | POA: Insufficient documentation

## 2014-05-17 DIAGNOSIS — R972 Elevated prostate specific antigen [PSA]: Secondary | ICD-10-CM

## 2014-05-17 DIAGNOSIS — E119 Type 2 diabetes mellitus without complications: Secondary | ICD-10-CM | POA: Diagnosis not present

## 2014-05-17 HISTORY — DX: Malignant neoplasm of prostate: C61

## 2014-05-17 NOTE — Progress Notes (Signed)
See progress note under physician encounter. 

## 2014-05-17 NOTE — Progress Notes (Signed)
Here today with wife to discuss options for treatment his prostate cancer. IPSS 17 and SHIM 20. Reports increased nocturia x2 months. Reports difficulty sleeping. Elevated PSA found by PCP. Works full time a Administrator, [employer]. Reports he occasionally has to strain to void. Reports strong steady urine stream. Denies hematuria or dysuria. Denies incontinence or leakage. Denies blood in stool or pain associated with bowel movements. Reports stools are loose but, relates this to gallstones. Denies weight loss or night sweats. Reports right posterior pelvic pain 2 on a scale of 0-10.

## 2014-05-17 NOTE — Progress Notes (Signed)
Radiation Oncology         [phone]) [phone] ________________________________  Initial outpatient Consultation  Name: [name] MRN: [id number]  Date: 05/17/2014  DOB: 1961-01-01  JK:DTOIZTIWP,YKDXI W, MD  Alexis Frock, MD   REFERRING PHYSICIAN: Alexis Frock, MD  DIAGNOSIS: 53 y.o. 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 gentleman.  He was noted to have an elevated PSA of 63 by his primary care physician, Dr. Elease Hashimoto.  Accordingly, he was referred for evaluation in urology by Dr. Tresa Moore,  digital rectal examination was performed at that time revealing an enlarged nodular gland.  The patient proceeded to transrectal ultrasound with 12 biopsies of the prostate.  The prostate volume measured 30 cc.  Out of 12 core biopsies, 10 were positive.  The maximum Gleason score was 4+4, and this was seen in 9 out of the 10 positive cores.  The patient reviewed the biopsy results with his urologist and he has kindly been referred today for discussion of potential radiation treatment options.   PREVIOUS RADIATION THERAPY: No  PAST MEDICAL HISTORY:  has a past medical history of Hypertension; Diabetes mellitus without complication (3/38); and Prostate cancer.    PAST SURGICAL HISTORY: Past Surgical History  Procedure Laterality Date  . Prostate biopsy    . Tonsillectomy      FAMILY HISTORY: family history includes Aneurysm in his father; Cancer in his maternal aunt, maternal uncle, and sister; Diabetes in his mother; Heart disease (age of onset: 82) in his mother; Hyperlipidemia in his father; Hypertension in his father.  SOCIAL HISTORY:  reports that he quit smoking about 29 years ago. His smoking use included Cigarettes. He has a 10 pack-year smoking history. His smokeless tobacco use includes Chew. He reports that he does not drink alcohol or use illicit  drugs.  ALLERGIES: Review of patient's allergies indicates no known allergies.  MEDICATIONS:  Current Outpatient Prescriptions  Medication Sig Dispense Refill  . amLODipine (NORVASC) 10 MG tablet TAKE 1 TABLET (10 MG TOTAL) BY MOUTH DAILY.  90 tablet  2  . BENICAR HCT 40-25 MG per tablet TAKE 1 TABLET BY MOUTH DAILY.  30 tablet  5  . fenofibrate (TRICOR) 145 MG tablet TAKE 1 TABLET (145 MG TOTAL) BY MOUTH DAILY.  30 tablet  4  . glucose blood (FREESTYLE LITE) test strip Use as instructed  DX: 250.00  100 each  3  . Lancets (FREESTYLE) lancets Use as instructed  DX: 250.00  100 each  3  . metFORMIN (GLUCOPHAGE) 500 MG tablet TAKE 2 TABLETS TWICE DAILY WITH MEALS.  360 tablet  1   No current facility-administered medications for this encounter.    REVIEW OF SYSTEMS:  A 15 point review of systems is documented in the electronic medical record. This was obtained by the nursing staff. However, I reviewed this with the patient to discuss relevant findings and make appropriate changes.  A comprehensive review of systems was negative..  The patient completed an IPSS and IIEF questionnaire.  H   PHYSICAL EXAM: This patient is in no acute distress.  He is alert and oriented.   height is 5\' 9"  (1.753 m) and weight is 235 lb (106.595 kg). His oral temperature is 98 F (36.7 C). His blood pressure is 135/69 and his pulse is 92. His respiration is 16 and oxygen saturation is 100%.  He exhibits  no respiratory distress or labored breathing.  He appears neurologically intact.  His mood is pleasant.  His affect is appropriate.  Please note the digital rectal exam findings described above.  KPS = 100  100 - Normal; no complaints; no evidence of disease. 90   - Able to carry on normal activity; minor signs or symptoms of disease. 80   - Normal activity with effort; some signs or symptoms of disease. 56   - Cares for self; unable to carry on normal activity or to do active work. 60   - Requires occasional  assistance, but is able to care for most of his personal needs. 50   - Requires considerable assistance and frequent medical care. 38   - Disabled; requires special care and assistance. 67   - Severely disabled; hospital admission is indicated although death not imminent. 84   - Very sick; hospital admission necessary; active supportive treatment necessary. 10   - Moribund; fatal processes progressing rapidly. 0     - Dead  Karnofsky DA, Abelmann Lotsee, Craver LS and Burchenal [facility] [phone]) The use of the nitrogen mustards in the palliative treatment of carcinoma: with particular reference to bronchogenic carcinoma Cancer 1 634-56   LABORATORY DATA:  No results found for this basename: WBC,  HGB,  HCT,  MCV,  PLT   Lab Results  Component Value Date   NA 136 11/25/2012   K 4.1 11/25/2012   CL 101 11/25/2012   CO2 27 11/25/2012   Lab Results  Component Value Date   ALT 34 11/25/2012   AST 24 11/25/2012   ALKPHOS 27* 11/25/2012   BILITOT 0.8 11/25/2012     RADIOGRAPHY: Nm Bone Scan Whole Body  05/05/2014   CLINICAL DATA:  Prostate cancer.  PSA 68.68  EXAM: NUCLEAR MEDICINE WHOLE BODY BONE SCAN  TECHNIQUE: Whole body anterior and posterior images were obtained approximately 3 hours after intravenous injection of radiopharmaceutical.  RADIOPHARMACEUTICALS:  27.0 mCi Technetium-99 MDP  COMPARISON:  None  FINDINGS: There are no foci of increased or decreased radiotracer uptake to suggest osseous metastatic disease.  Mild increased radiotracer uptake in the [facility] joints bilaterally likely reflecting mild degenerative changes.  Normal physiologic activity is identified within the kidneys and urinary bladder.  IMPRESSION: No evidence of osseous metastatic disease.   Electronically Signed   By: Kathreen Devoid   On: 05/05/2014 13:01      IMPRESSION: This gentleman is a 53 y.o. 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  His T-Stage, Gleason's Score, and PSA put him  into the high risk group.  Accordingly he is eligible for a variety of potential treatment options including radical prostatectomy potentially followed by adjuvant or salvage radiotherapy versus up front definitive radiotherapy with androgen suppression for 2-3 years.  PLAN:Today I reviewed the findings and workup thus far.  We discussed the natural history of prostate cancer.  We reviewed the the implications of T-stage, Gleason's Score, and PSA on decision-making and outcomes related to prostate cancer.  We discussed radiation treatment in the management of prostate cancer with regard to the logistics and delivery of external beam radiation treatment as well as the logistics and delivery of prostate brachytherapy.  We compared and contrasted each of these approaches and also compared these against prostatectomy.    The patient focused most of his questions and interest in robotic-assisted laparoscopic radical prostatectomy.  We discussed some of the potential  advantages of surgery including surgical staging, the availability of salvage radiotherapy to the prostatic fossa, and the confidence associated with immediate biochemical response.  We discussed some of the potential proven indications for postoperative radiotherapy including positive margins, extracapsular extension, and seminal vesicle involvement. We also talked about some of the other potential findings leading to a recommendation for radiotherapy including a non-zero postoperative PSA and positive lymph nodes.   I filled out a patient counseling form for him outlining his disease characteristics with relevant treatment diagrams and a thorough delineation of radiation including the logistics. The counseling form highlighted our discussion on the potential side effects associated with radiation therapy. The patient was given the form and we retained a copy for our records.   The patient would like to proceed with prostatectomy. I enjoyed meeting  with him today, and will look forward to participating in the care of this very nice gentleman.  I will look forward to following his progress   I spent 60 minutes minutes face to face with the patient and more than 50% of that time was spent in counseling and/or coordination of care.     ------------------------------------------------  Sheral Apley. Tammi Klippel, M.D.

## 2014-05-26 ENCOUNTER — Other Ambulatory Visit: Payer: Self-pay | Admitting: Urology

## 2014-06-14 ENCOUNTER — Other Ambulatory Visit: Payer: Self-pay | Admitting: Family Medicine

## 2014-07-02 ENCOUNTER — Other Ambulatory Visit: Payer: Self-pay | Admitting: Family Medicine

## 2014-07-21 ENCOUNTER — Other Ambulatory Visit: Payer: Self-pay | Admitting: Family Medicine

## 2014-07-29 NOTE — Patient Instructions (Addendum)
[name]  07/29/2014   Your procedure is scheduled on: 08/06/2014    Come thru the [place] entrance.   Follow the Signs to [place] at  [place]      am  Call this number if you have problems the morning of surgery: [phone]   Remember: Eat a good healthy snack prior to bedtime.     Do not eat food or drink liquids after midnight.   Take these medicines the morning of surgery with A SIP OF WATER:  Amlodipine             Do not take any diabetic medications am of surgery    Do not wear jewelry,   Do not wear lotions, powders, or perfumes.  deodorant.   Men may shave face and neck.  Do not bring valuables to the hospital.  Contacts, dentures or bridgework may not be worn into surgery.  Leave suitcase in the car. After surgery it may be brought to your room.  For patients admitted to the hospital, checkout time is 11:00 AM the day of  discharge.          Please read over the following fact sheets that you were given: , coughing and deep breathing exercises, leg exercises            [place] - Preparing for Surgery Before surgery, you can play an important role.  Because skin is not sterile, your skin needs to be as free of germs as possible.  You can reduce the number of germs on your skin by washing with CHG (chlorahexidine gluconate) soap before surgery.  CHG is an antiseptic cleaner which kills germs and bonds with the skin to continue killing germs even after washing. Please DO NOT use if you have an allergy to CHG or antibacterial soaps.  If your skin becomes reddened/irritated stop using the CHG and inform your nurse when you arrive at Short Stay. Do not shave (including legs and underarms) for at least 48 hours prior to the first CHG shower.  You may shave your face/neck. Please follow these instructions carefully:  1.  Shower with CHG Soap the night before surgery and the  morning of Surgery.  2.  If you choose to wash your hair, wash your hair first as  usual with your  normal  shampoo.  3.  After you shampoo, rinse your hair and body thoroughly to remove the  shampoo.                           4.  Use CHG as you would any other liquid soap.  You can apply chg directly  to the skin and wash                       Gently with a scrungie or clean washcloth.  5.  Apply the CHG Soap to your body ONLY FROM THE NECK DOWN.   Do not use on face/ open                           Wound or open sores. Avoid contact with eyes, ears mouth and genitals (private parts).                       Wash face,  Genitals (private parts) with your normal soap.  6.  Wash thoroughly, paying special attention to the area where your surgery  will be performed.  7.  Thoroughly rinse your body with warm water from the neck down.  8.  DO NOT shower/wash with your normal soap after using and rinsing off  the CHG Soap.                9.  Pat yourself dry with a clean towel.            10.  Wear clean pajamas.            11.  Place clean sheets on your bed the night of your first shower and do not  sleep with pets. Day of Surgery : Do not apply any lotions/deodorants the morning of surgery.  Please wear clean clothes to the hospital/surgery center.  FAILURE TO FOLLOW THESE INSTRUCTIONS MAY RESULT IN THE CANCELLATION OF YOUR SURGERY PATIENT SIGNATURE_________________________________  NURSE SIGNATURE__________________________________  ________________________________________________________________________

## 2014-08-02 ENCOUNTER — Encounter (HOSPITAL_COMMUNITY)
Admission: RE | Admit: 2014-08-02 | Discharge: 2014-08-02 | Disposition: A | Discharge status: Home / Self Care | Payer: BC Managed Care – PPO | Source: Ambulatory Visit | Attending: Urology | Admitting: Urology

## 2014-08-02 ENCOUNTER — Ambulatory Visit (HOSPITAL_COMMUNITY)
Admission: RE | Admit: 2014-08-02 | Discharge: 2014-08-02 | Disposition: A | Discharge status: Home / Self Care | Payer: BC Managed Care – PPO | Source: Ambulatory Visit | Attending: Urology | Admitting: Urology

## 2014-08-02 ENCOUNTER — Encounter (HOSPITAL_COMMUNITY): Payer: Self-pay

## 2014-08-02 DIAGNOSIS — I10 Essential (primary) hypertension: Secondary | ICD-10-CM

## 2014-08-02 LAB — BASIC METABOLIC PANEL
Anion gap: 14 (ref 5–15)
BUN: 22 mg/dL (ref 6–23)
CALCIUM: 9.7 mg/dL (ref 8.4–10.5)
CO2: 26 mEq/L (ref 19–32)
CREATININE: 1.15 mg/dL (ref 0.50–1.35)
Chloride: 94 mEq/L — ABNORMAL LOW (ref 96–112)
GFR calc Af Amer: 81 mL/min — ABNORMAL LOW (ref >90)
GFR calc non Af Amer: 70 mL/min — ABNORMAL LOW (ref >90)
GLUCOSE: 273 mg/dL — AB (ref 70–99)
Potassium: 4.1 mEq/L (ref 3.7–5.3)
Sodium: 134 mEq/L — ABNORMAL LOW (ref 137–147)

## 2014-08-02 LAB — CBC
HCT: 39.9 % (ref 39.0–52.0)
HEMOGLOBIN: 14 g/dL (ref 13.0–17.0)
MCH: 31.3 pg (ref 26.0–34.0)
MCHC: 35.1 g/dL (ref 30.0–36.0)
MCV: 89.1 fL (ref 78.0–100.0)
Platelets: 169 10*3/uL (ref 150–400)
RBC: 4.48 MIL/uL (ref 4.22–5.81)
RDW: 12.8 % (ref 11.5–15.5)
WBC: 5.1 10*3/uL (ref 4.0–10.5)

## 2014-08-02 NOTE — Progress Notes (Signed)
BMP done 08/02/2014 faxed via EPIC to Dr Tresa Moore.

## 2014-08-03 NOTE — Progress Notes (Signed)
Per Anesthesia, per Kieth Brightly, phlebotomist, anesthesia will look at ekg done at time of preop on 08/02/2014 since EKG unable to be confirmed at this time.

## 2014-08-06 ENCOUNTER — Inpatient Hospital Stay (HOSPITAL_COMMUNITY)
Admission: RE | Admit: 2014-08-06 | Discharge: 2014-08-07 | DRG: 708 | Disposition: A | Discharge status: Home / Self Care | Payer: BC Managed Care – PPO | Source: Ambulatory Visit | Attending: Urology | Admitting: Urology

## 2014-08-06 ENCOUNTER — Encounter (HOSPITAL_COMMUNITY): Payer: Self-pay | Admitting: *Deleted

## 2014-08-06 ENCOUNTER — Inpatient Hospital Stay (HOSPITAL_COMMUNITY): Payer: BC Managed Care – PPO | Admitting: Anesthesiology

## 2014-08-06 ENCOUNTER — Encounter (HOSPITAL_COMMUNITY)
Admission: RE | Disposition: A | Discharge status: Home / Self Care | Payer: Self-pay | Source: Ambulatory Visit | Attending: Urology

## 2014-08-06 DIAGNOSIS — E119 Type 2 diabetes mellitus without complications: Secondary | ICD-10-CM | POA: Diagnosis present

## 2014-08-06 DIAGNOSIS — Z87891 Personal history of nicotine dependence: Secondary | ICD-10-CM

## 2014-08-06 DIAGNOSIS — Z6830 Body mass index (BMI) 30.0-30.9, adult: Secondary | ICD-10-CM | POA: Diagnosis not present

## 2014-08-06 DIAGNOSIS — C61 Malignant neoplasm of prostate: Principal | ICD-10-CM | POA: Diagnosis present

## 2014-08-06 DIAGNOSIS — Z79899 Other long term (current) drug therapy: Secondary | ICD-10-CM

## 2014-08-06 DIAGNOSIS — Z8042 Family history of malignant neoplasm of prostate: Secondary | ICD-10-CM | POA: Diagnosis not present

## 2014-08-06 DIAGNOSIS — N529 Male erectile dysfunction, unspecified: Secondary | ICD-10-CM | POA: Diagnosis present

## 2014-08-06 DIAGNOSIS — E668 Other obesity: Secondary | ICD-10-CM | POA: Diagnosis present

## 2014-08-06 DIAGNOSIS — E785 Hyperlipidemia, unspecified: Secondary | ICD-10-CM | POA: Diagnosis present

## 2014-08-06 DIAGNOSIS — I10 Essential (primary) hypertension: Secondary | ICD-10-CM | POA: Diagnosis present

## 2014-08-06 HISTORY — PX: ROBOT ASSISTED LAPAROSCOPIC RADICAL PROSTATECTOMY: SHX5141

## 2014-08-06 HISTORY — PX: LYMPHADENECTOMY: SHX5960

## 2014-08-06 LAB — GLUCOSE, CAPILLARY
GLUCOSE-CAPILLARY: 249 mg/dL — AB (ref 70–99)
Glucose-Capillary: 262 mg/dL — ABNORMAL HIGH (ref 70–99)

## 2014-08-06 LAB — ABO/RH: ABO/RH(D): A POS

## 2014-08-06 LAB — TYPE AND SCREEN
ABO/RH(D): A POS
Antibody Screen: NEGATIVE

## 2014-08-06 LAB — HEMOGLOBIN AND HEMATOCRIT, BLOOD
HEMATOCRIT: 38.8 % — AB (ref 39.0–52.0)
Hemoglobin: 13.5 g/dL (ref 13.0–17.0)

## 2014-08-06 SURGERY — ROBOTIC ASSISTED LAPAROSCOPIC RADICAL PROSTATECTOMY
Anesthesia: General

## 2014-08-06 MED ORDER — NEOSTIGMINE METHYLSULFATE 10 MG/10ML IV SOLN
INTRAVENOUS | Status: DC | PRN
Start: 2014-08-06 — End: 2014-08-06
  Administered 2014-08-06: 4 mg via INTRAVENOUS

## 2014-08-06 MED ORDER — HYDROMORPHONE HCL 1 MG/ML IJ SOLN
0.2500 mg | INTRAMUSCULAR | Status: DC | PRN
  Administered 2014-08-06: 0.5 mg via INTRAVENOUS

## 2014-08-06 MED ORDER — INSULIN ASPART 100 UNIT/ML ~~LOC~~ SOLN
0.0000 [IU] | Freq: Three times a day (TID) | SUBCUTANEOUS | Status: DC
  Administered 2014-08-07: 11 [IU] via SUBCUTANEOUS

## 2014-08-06 MED ORDER — SODIUM CHLORIDE 0.9 % IJ SOLN
INTRAMUSCULAR | Status: AC
  Filled 2014-08-06: qty 20

## 2014-08-06 MED ORDER — LIDOCAINE HCL (CARDIAC) 20 MG/ML IV SOLN
INTRAVENOUS | Status: DC | PRN
  Administered 2014-08-06: 50 mg via INTRAVENOUS

## 2014-08-06 MED ORDER — NEOSTIGMINE METHYLSULFATE 10 MG/10ML IV SOLN
INTRAVENOUS | Status: AC
  Filled 2014-08-06: qty 1

## 2014-08-06 MED ORDER — INSULIN ASPART 100 UNIT/ML ~~LOC~~ SOLN
SUBCUTANEOUS | Status: DC | PRN
  Administered 2014-08-06: 4 [IU] via SUBCUTANEOUS

## 2014-08-06 MED ORDER — ACETAMINOPHEN 500 MG PO TABS
1000.0000 mg | ORAL_TABLET | Freq: Four times a day (QID) | ORAL | Status: DC
  Administered 2014-08-06 – 2014-08-07 (×3): 1000 mg via ORAL
  Filled 2014-08-06 (×4): qty 2

## 2014-08-06 MED ORDER — AMLODIPINE BESYLATE 10 MG PO TABS
10.0000 mg | ORAL_TABLET | Freq: Every day | ORAL | Status: DC
  Administered 2014-08-07: 10 mg via ORAL
  Filled 2014-08-06: qty 1

## 2014-08-06 MED ORDER — INSULIN ASPART 100 UNIT/ML ~~LOC~~ SOLN
SUBCUTANEOUS | Status: AC
  Administered 2014-08-06: 5 [IU] via SUBCUTANEOUS
  Filled 2014-08-06: qty 1

## 2014-08-06 MED ORDER — FENOFIBRATE 160 MG PO TABS
160.0000 mg | ORAL_TABLET | Freq: Every day | ORAL | Status: DC
  Administered 2014-08-06 – 2014-08-07 (×2): 160 mg via ORAL
  Filled 2014-08-06 (×2): qty 1

## 2014-08-06 MED ORDER — INSULIN ASPART 100 UNIT/ML ~~LOC~~ SOLN
5.0000 [IU] | Freq: Once | SUBCUTANEOUS | Status: AC
  Administered 2014-08-06: 5 [IU] via SUBCUTANEOUS

## 2014-08-06 MED ORDER — INSULIN ASPART 100 UNIT/ML ~~LOC~~ SOLN
SUBCUTANEOUS | Status: AC
  Filled 2014-08-06: qty 1

## 2014-08-06 MED ORDER — LIDOCAINE HCL (CARDIAC) 20 MG/ML IV SOLN
INTRAVENOUS | Status: AC
  Filled 2014-08-06: qty 5

## 2014-08-06 MED ORDER — PROPOFOL 10 MG/ML IV BOLUS
INTRAVENOUS | Status: DC | PRN
  Administered 2014-08-06: 200 mg via INTRAVENOUS

## 2014-08-06 MED ORDER — LACTATED RINGERS IV SOLN
INTRAVENOUS | Status: AC
  Administered 2014-08-06: 16:00:00 via INTRAVENOUS
  Administered 2014-08-06: 1000 mL via INTRAVENOUS

## 2014-08-06 MED ORDER — FENTANYL CITRATE 0.05 MG/ML IJ SOLN
INTRAMUSCULAR | Status: AC
  Filled 2014-08-06: qty 5

## 2014-08-06 MED ORDER — OLMESARTAN MEDOXOMIL-HCTZ 40-25 MG PO TABS
1.0000 | ORAL_TABLET | Freq: Every day | ORAL | Status: DC
Start: 2014-08-06 — End: 2014-08-06

## 2014-08-06 MED ORDER — CEFAZOLIN SODIUM-DEXTROSE 2-3 GM-% IV SOLR
INTRAVENOUS | Status: AC
  Filled 2014-08-06: qty 50

## 2014-08-06 MED ORDER — GLYCOPYRROLATE 0.2 MG/ML IJ SOLN
INTRAMUSCULAR | Status: AC
  Filled 2014-08-06: qty 3

## 2014-08-06 MED ORDER — IRBESARTAN 300 MG PO TABS
300.0000 mg | ORAL_TABLET | Freq: Every day | ORAL | Status: DC
  Administered 2014-08-06 – 2014-08-07 (×2): 300 mg via ORAL
  Filled 2014-08-06 (×2): qty 1

## 2014-08-06 MED ORDER — HYDROCHLOROTHIAZIDE 25 MG PO TABS
25.0000 mg | ORAL_TABLET | Freq: Every day | ORAL | Status: DC
  Administered 2014-08-06 – 2014-08-07 (×2): 25 mg via ORAL
  Filled 2014-08-06 (×2): qty 1

## 2014-08-06 MED ORDER — MIDAZOLAM HCL 2 MG/2ML IJ SOLN
INTRAMUSCULAR | Status: AC
  Filled 2014-08-06: qty 2

## 2014-08-06 MED ORDER — ROCURONIUM BROMIDE 100 MG/10ML IV SOLN
INTRAVENOUS | Status: AC
  Filled 2014-08-06: qty 1

## 2014-08-06 MED ORDER — DEXAMETHASONE SODIUM PHOSPHATE 10 MG/ML IJ SOLN
INTRAMUSCULAR | Status: DC | PRN
  Administered 2014-08-06: 10 mg via INTRAVENOUS

## 2014-08-06 MED ORDER — HYDROMORPHONE HCL 1 MG/ML IJ SOLN
INTRAMUSCULAR | Status: DC | PRN
  Administered 2014-08-06 (×2): 1 mg via INTRAVENOUS

## 2014-08-06 MED ORDER — OXYCODONE HCL 5 MG PO TABS
5.0000 mg | ORAL_TABLET | ORAL | Status: DC | PRN
  Administered 2014-08-06 – 2014-08-07 (×4): 5 mg via ORAL
  Filled 2014-08-06 (×4): qty 1

## 2014-08-06 MED ORDER — ONDANSETRON HCL 4 MG/2ML IJ SOLN
INTRAMUSCULAR | Status: DC | PRN
  Administered 2014-08-06: 4 mg via INTRAVENOUS

## 2014-08-06 MED ORDER — MIDAZOLAM HCL 5 MG/5ML IJ SOLN
INTRAMUSCULAR | Status: DC | PRN
  Administered 2014-08-06: 2 mg via INTRAVENOUS

## 2014-08-06 MED ORDER — HYDROMORPHONE HCL 2 MG/ML IJ SOLN
INTRAMUSCULAR | Status: AC
  Filled 2014-08-06: qty 1

## 2014-08-06 MED ORDER — PROPOFOL 10 MG/ML IV BOLUS
INTRAVENOUS | Status: AC
  Filled 2014-08-06: qty 20

## 2014-08-06 MED ORDER — FENTANYL CITRATE 0.05 MG/ML IJ SOLN
INTRAMUSCULAR | Status: AC
  Filled 2014-08-06: qty 2

## 2014-08-06 MED ORDER — SENNOSIDES-DOCUSATE SODIUM 8.6-50 MG PO TABS
1.0000 | ORAL_TABLET | Freq: Two times a day (BID) | ORAL | Status: DC
  Administered 2014-08-06 – 2014-08-07 (×2): 1 via ORAL
  Filled 2014-08-06 (×5): qty 1

## 2014-08-06 MED ORDER — HYDROCODONE-ACETAMINOPHEN 5-325 MG PO TABS: 1.0000 | ORAL_TABLET | Freq: Four times a day (QID) | ORAL | Status: DC | PRN

## 2014-08-06 MED ORDER — 0.9 % SODIUM CHLORIDE (POUR BTL) OPTIME
TOPICAL | Status: DC | PRN
  Administered 2014-08-06: 1000 mL

## 2014-08-06 MED ORDER — SODIUM CHLORIDE 0.9 % IV BOLUS (SEPSIS)
1000.0000 mL | Freq: Once | INTRAVENOUS | Status: AC
  Administered 2014-08-06: 1000 mL via INTRAVENOUS

## 2014-08-06 MED ORDER — GLYCOPYRROLATE 0.2 MG/ML IJ SOLN
INTRAMUSCULAR | Status: DC | PRN
  Administered 2014-08-06: 0.6 mg via INTRAVENOUS

## 2014-08-06 MED ORDER — CEFAZOLIN SODIUM-DEXTROSE 2-3 GM-% IV SOLR
2.0000 g | INTRAVENOUS | Status: AC
  Administered 2014-08-06 (×2): 2 g via INTRAVENOUS

## 2014-08-06 MED ORDER — BUPIVACAINE LIPOSOME 1.3 % IJ SUSP
20.0000 mL | Freq: Once | INTRAMUSCULAR | Status: AC
  Administered 2014-08-06: 20 mL
  Filled 2014-08-06: qty 20

## 2014-08-06 MED ORDER — METFORMIN HCL 500 MG PO TABS
1000.0000 mg | ORAL_TABLET | Freq: Two times a day (BID) | ORAL | Status: DC
  Administered 2014-08-07: 1000 mg via ORAL
  Filled 2014-08-06 (×3): qty 2

## 2014-08-06 MED ORDER — ONDANSETRON HCL 4 MG/2ML IJ SOLN
INTRAMUSCULAR | Status: AC
  Filled 2014-08-06: qty 2

## 2014-08-06 MED ORDER — DEXTROSE-NACL 5-0.45 % IV SOLN
INTRAVENOUS | Status: DC
  Administered 2014-08-06 – 2014-08-07 (×2): via INTRAVENOUS

## 2014-08-06 MED ORDER — DEXAMETHASONE SODIUM PHOSPHATE 10 MG/ML IJ SOLN
INTRAMUSCULAR | Status: AC
  Filled 2014-08-06: qty 1

## 2014-08-06 MED ORDER — FENTANYL CITRATE 0.05 MG/ML IJ SOLN
INTRAMUSCULAR | Status: DC | PRN
  Administered 2014-08-06: 25 ug via INTRAVENOUS
  Administered 2014-08-06 (×2): 100 ug via INTRAVENOUS
  Administered 2014-08-06: 25 ug via INTRAVENOUS
  Administered 2014-08-06: 50 ug via INTRAVENOUS

## 2014-08-06 MED ORDER — PROMETHAZINE HCL 25 MG/ML IJ SOLN: 6.2500 mg | INTRAMUSCULAR | Status: DC | PRN

## 2014-08-06 MED ORDER — HYDROMORPHONE HCL 1 MG/ML IJ SOLN
INTRAMUSCULAR | Status: AC
  Filled 2014-08-06: qty 1

## 2014-08-06 MED ORDER — CIPROFLOXACIN HCL 500 MG PO TABS: 500.0000 mg | ORAL_TABLET | Freq: Two times a day (BID) | ORAL | Status: DC

## 2014-08-06 MED ORDER — STERILE WATER FOR IRRIGATION IR SOLN
Status: DC | PRN
  Administered 2014-08-06: 1500 mL

## 2014-08-06 MED ORDER — HYDROMORPHONE HCL 1 MG/ML IJ SOLN
0.5000 mg | INTRAMUSCULAR | Status: DC | PRN
  Administered 2014-08-07 (×2): 1 mg via INTRAVENOUS
  Filled 2014-08-06 (×2): qty 1

## 2014-08-06 MED ORDER — ROCURONIUM BROMIDE 100 MG/10ML IV SOLN
INTRAVENOUS | Status: DC | PRN
  Administered 2014-08-06: 10 mg via INTRAVENOUS
  Administered 2014-08-06: 60 mg via INTRAVENOUS
  Administered 2014-08-06 (×2): 10 mg via INTRAVENOUS
  Administered 2014-08-06: 40 mg via INTRAVENOUS

## 2014-08-06 SURGICAL SUPPLY — 56 items
CABLE HIGH FREQUENCY MONO STRZ (ELECTRODE) ×4 IMPLANT
CANISTER SUCT 3000ML (MISCELLANEOUS) ×4 IMPLANT
CATH FOLEY 2WAY SLVR 18FR 30CC (CATHETERS) ×4 IMPLANT
CATH TIEMANN FOLEY 18FR 5CC (CATHETERS) ×4 IMPLANT
CHLORAPREP W/TINT 26ML (MISCELLANEOUS) ×4 IMPLANT
CLIP LIGATING HEM O LOK PURPLE (MISCELLANEOUS) ×8 IMPLANT
CLOTH BEACON ORANGE TIMEOUT ST (SAFETY) ×4 IMPLANT
CONT SPEC 4OZ CLIKSEAL STRL BL (MISCELLANEOUS) ×4 IMPLANT
COVER SURGICAL LIGHT HANDLE (MISCELLANEOUS) ×4 IMPLANT
COVER TIP SHEARS 8 DVNC (MISCELLANEOUS) ×2 IMPLANT
COVER TIP SHEARS 8MM DA VINCI (MISCELLANEOUS) ×2
CUTTER ECHEON FLEX ENDO 45 340 (ENDOMECHANICALS) ×4 IMPLANT
DECANTER SPIKE VIAL GLASS SM (MISCELLANEOUS) ×4 IMPLANT
DRSG TEGADERM 4X4.75 (GAUZE/BANDAGES/DRESSINGS) ×8 IMPLANT
DRSG TEGADERM 6X8 (GAUZE/BANDAGES/DRESSINGS) ×8 IMPLANT
ELECT REM PT RETURN 9FT ADLT (ELECTROSURGICAL) ×4
ELECTRODE REM PT RTRN 9FT ADLT (ELECTROSURGICAL) ×2 IMPLANT
GAUZE SPONGE 2X2 8PLY STRL LF (GAUZE/BANDAGES/DRESSINGS) ×2 IMPLANT
GLOVE BIO SURGEON STRL SZ 6.5 (GLOVE) ×3 IMPLANT
GLOVE BIO SURGEONS STRL SZ 6.5 (GLOVE) ×1
GLOVE BIOGEL M STRL SZ7.5 (GLOVE) ×8 IMPLANT
GLOVE BIOGEL PI IND STRL 7.5 (GLOVE) IMPLANT
GLOVE BIOGEL PI INDICATOR 7.5 (GLOVE)
GOWN STRL REUS W/TWL LRG LVL4 (GOWN DISPOSABLE) ×12 IMPLANT
HOLDER FOLEY CATH W/STRAP (MISCELLANEOUS) ×4 IMPLANT
IV LACTATED RINGERS 1000ML (IV SOLUTION) ×4 IMPLANT
KIT ACCESSORY DA VINCI DISP (KITS) ×2
KIT ACCESSORY DVNC DISP (KITS) ×2 IMPLANT
KIT PROCEDURE DA VINCI SI (MISCELLANEOUS) ×2
KIT PROCEDURE DVNC SI (MISCELLANEOUS) ×2 IMPLANT
LIQUID BAND (GAUZE/BANDAGES/DRESSINGS) ×4 IMPLANT
NEEDLE INSUFFLATION 14GA 120MM (NEEDLE) ×4 IMPLANT
NEEDLE SPNL 22GX7 SPINOC (NEEDLE) IMPLANT
PACK ROBOT UROLOGY CUSTOM (CUSTOM PROCEDURE TRAY) ×4 IMPLANT
RELOAD GREEN ECHELON 45 (STAPLE) ×4 IMPLANT
SET TUBE IRRIG SUCTION NO TIP (IRRIGATION / IRRIGATOR) ×4 IMPLANT
SOLUTION ELECTROLUBE (MISCELLANEOUS) ×4 IMPLANT
SPONGE DRAIN TRACH 4X4 STRL 2S (GAUZE/BANDAGES/DRESSINGS) ×4 IMPLANT
SPONGE GAUZE 2X2 STER 10/PKG (GAUZE/BANDAGES/DRESSINGS) ×2
SPONGE LAP 4X18 X RAY DECT (DISPOSABLE) ×8 IMPLANT
SUT ETHILON 3 0 PS 1 (SUTURE) ×4 IMPLANT
SUT MNCRL AB 4-0 PS2 18 (SUTURE) ×8 IMPLANT
SUT PDS AB 1 CT1 27 (SUTURE) ×8 IMPLANT
SUT VIC AB 2-0 SH 27 (SUTURE) ×2
SUT VIC AB 2-0 SH 27X BRD (SUTURE) ×2 IMPLANT
SUT VIC AB 3-0 SH 27 (SUTURE) ×4
SUT VIC AB 3-0 SH 27X BRD (SUTURE) ×4 IMPLANT
SUT VICRYL 0 UR6 27IN ABS (SUTURE) ×4 IMPLANT
SUT VLOC BARB 180 ABS3/0GR12 (SUTURE) ×12
SUTURE VLOC BRB 180 ABS3/0GR12 (SUTURE) ×6 IMPLANT
SYR 27GX1/2 1ML LL SAFETY (SYRINGE) ×4 IMPLANT
TAPE CLOTH SURG 6X10 WHT LF (GAUZE/BANDAGES/DRESSINGS) ×4 IMPLANT
TOWEL OR 17X26 10 PK STRL BLUE (TOWEL DISPOSABLE) ×4 IMPLANT
TOWEL OR NON WOVEN STRL DISP B (DISPOSABLE) ×4 IMPLANT
TROCAR 12M 150ML BLUNT (TROCAR) ×4 IMPLANT
WATER STERILE IRR 1500ML POUR (IV SOLUTION) ×8 IMPLANT

## 2014-08-06 NOTE — H&P (Signed)
[name] [place] is an 53 y.o. male.    Chief Complaint: Pre-Op Robotic Radical Prostatectomy  HPI:    1- High Risk Prostate Cancer - Gleason 4+4=8 and Gleson 4+3=7 in 8/10 cores up to 95% including bilateral base and apex by biopsy 03/2014 on eval PSA 69. CT and bone scan negative for metastatic disease (small lesions in pelvic bone likely bone islands, not hypermetabolic on PET).  DRE 03/2014 60gm and nodular.  Uncle with prostate cancer at late age. Volume 30gm by TRUS.   PMH sig for DM2, HLD, HTN.   Today [name] is seen in to proceed with robotic radical prostatectomy as initial therapy for his aggressive prostate cancer.   Past Medical History  Diagnosis Date  . Hypertension   . Diabetes mellitus without complication 5/99    type 2  . Prostate cancer     Past Surgical History  Procedure Laterality Date  . Prostate biopsy    . Tonsillectomy      Family History  Problem Relation Age of Onset  . Diabetes Mother     type ll  . Heart disease Mother 5    CHF  . Hypertension Father   . Hyperlipidemia Father   . Aneurysm Father     AAA  . Cancer Sister     lung  . Cancer Maternal Aunt     colon  . Cancer Maternal Uncle     prostate   Social History:  reports that he quit smoking about 30 years ago. His smoking use included Cigarettes. He has a 10 pack-year smoking history. His smokeless tobacco use includes Chew. He reports that he drinks alcohol. He reports that he does not use illicit drugs.  Allergies: No Known Allergies  No prescriptions prior to admission    No results found for this or any previous visit (from the past 48 hour(s)). No results found.  Review of Systems  Constitutional: Negative.  Negative for fever and chills.  HENT: Negative.   Eyes: Negative.   Respiratory: Negative.   Cardiovascular: Negative.   Gastrointestinal: Negative.   Genitourinary: Negative.   Musculoskeletal: Negative.   Skin: Negative.   Neurological: Negative.    Endo/Heme/Allergies: Negative.   Psychiatric/Behavioral: Negative.     There were no vitals taken for this visit. Physical Exam  Constitutional: He is oriented to person, place, and time. He appears well-developed and well-nourished.  HENT:  Head: Normocephalic.  Eyes: Pupils are equal, round, and reactive to light.  Neck: Normal range of motion.  Cardiovascular: Normal rate.   GI: Soft.  Genitourinary: Penis normal.  No CVAT  Musculoskeletal: Normal range of motion.  Neurological: He is alert and oriented to person, place, and time.  Skin: Skin is warm and dry.  Psychiatric: He has a normal mood and affect. His behavior is normal. Judgment and thought content normal.     Assessment/Plan   1- High Risk Prostate Cancer -  We rediscussed prostatectomy and specifically robotic prostatectomy with bilateral pelvic lymphadenectomy being the technique that I most commonly perform. I reshowed the patient on their abdomen the approximately 6 small incision (trocar) sites as well as presumed extraction sites with robotic approach as well as possible open incision sites should open conversion be necessary. Were discussed peri-operative risks including bleeding, infection, deep vein thrombosis, pulmonary embolism, compartment syndrome, nuropathy / neuropraxia, heart attack, stroke, death, as well as long-term risks such as non-cure / need for additional therapy. We specifically readdressed that the procedure would compromise  urinary control leading to stress incontinence which typically resolves with time and pelvic rehabilitation (Kegel's, etc..), but can sometimes be permanent and require additional therapy including surgery. We also specifically readdressed sexual sequellae including significant erectile dysfunction which typically partially resolves with time but can also be permanent and require additional therapy including surgery.   We rediscussed the typical hospital course including usual 1-2  night hospitalization, discharge with foley catheter in place usually for 1-2 weeks before voiding trial as well as usually 2 week recovery until able to perform most non-strenuous activity and 6 weeks until able to return to most jobs and more strenuous activity such as exercise.   Zamira Hickam 08/06/2014, 6:33 AM

## 2014-08-06 NOTE — Discharge Instructions (Signed)

## 2014-08-06 NOTE — Transfer of Care (Signed)
Immediate Anesthesia Transfer of Care Note  Patient: [name]  Procedure(s) Performed: Procedure(s): ROBOTIC ASSISTED LAPAROSCOPIC RADICAL PROSTATECTOMY WITH INDOCYANINE GREEN DYE (N/A) PELVIC LYMPH NODE DISSECTION (Bilateral)  Patient Location: PACU  Anesthesia Type:General  Level of Consciousness: awake and patient cooperative  Airway & Oxygen Therapy: Patient Spontanous Breathing and Patient connected to face mask oxygen  Post-op Assessment: Report given to PACU RN and Post -op Vital signs reviewed and stable  Post vital signs: Reviewed and stable  Complications: No apparent anesthesia complications

## 2014-08-06 NOTE — Brief Op Note (Signed)
08/06/2014  4:36 PM  PATIENT:  [name]  53 y.o. male  PRE-OPERATIVE DIAGNOSIS:  HIGH RISK PROSTATE CANCER  POST-OPERATIVE DIAGNOSIS:  HIGH RISK PROSTATE CANCER  PROCEDURE:  Procedure(s): ROBOTIC ASSISTED LAPAROSCOPIC RADICAL PROSTATECTOMY WITH INDOCYANINE GREEN DYE (N/A) PELVIC LYMPH NODE DISSECTION (Bilateral)  SURGEON:  Surgeon(s) and Role:    * Junious Dresser, MD - Primary  PHYSICIAN ASSISTANT:   ASSISTANTS: Clemetine Marker, PA   ANESTHESIA:   general  EBL:  Total I/O In: 1000 [I.V.:1000] Out: 300 [Blood:300]  BLOOD ADMINISTERED:none  DRAINS: 1 -JP to bulb, 2 - Foley to gravity   LOCAL MEDICATIONS USED:  MARCAINE     SPECIMEN:  Source of Specimen:  1 - periprostatic fat, 2 - radical prostatectomy, 3 - bilateral pelvic lymph nodes  DISPOSITION OF SPECIMEN:  PATHOLOGY  COUNTS:  YES  TOURNIQUET:  * No tourniquets in log *  DICTATION: .Other Dictation: Dictation Number [id number]  PLAN OF CARE: Admit to inpatient   PATIENT DISPOSITION:  PACU - hemodynamically stable.   Delay start of Pharmacological VTE agent (>24hrs) due to surgical blood loss or risk of bleeding: yes

## 2014-08-06 NOTE — Anesthesia Preprocedure Evaluation (Signed)
Anesthesia Evaluation  Patient identified by MRN, date of birth, ID band Patient awake    Reviewed: Allergy & Precautions, H&P , NPO status , Patient's Chart, lab work & pertinent test results  Airway Mallampati: II  TM Distance: >3 FB Neck ROM: Full    Dental no notable dental hx.    Pulmonary former smoker,  breath sounds clear to auscultation  Pulmonary exam normal       Cardiovascular Exercise Tolerance: Good hypertension, Pt. on medications Rhythm:Regular Rate:Normal     Neuro/Psych negative neurological ROS  negative psych ROS   GI/Hepatic negative GI ROS, Neg liver ROS,   Endo/Other  diabetes, Type 2, Oral Hypoglycemic Agents  Renal/GU negative Renal ROS  negative genitourinary   Musculoskeletal negative musculoskeletal ROS (+)   Abdominal (+) + obese,   Peds negative pediatric ROS (+)  Hematology negative hematology ROS (+)   Anesthesia Other Findings   Reproductive/Obstetrics negative OB ROS                             Anesthesia Physical Anesthesia Plan  ASA: III  Anesthesia Plan: General   Post-op Pain Management:    Induction: Intravenous  Airway Management Planned: Oral ETT  Additional Equipment:   Intra-op Plan:   Post-operative Plan: Extubation in OR  Informed Consent: I have reviewed the patients History and Physical, chart, labs and discussed the procedure including the risks, benefits and alternatives for the proposed anesthesia with the patient or authorized representative who has indicated his/her understanding and acceptance.   Dental advisory given  Plan Discussed with: CRNA  Anesthesia Plan Comments:         Anesthesia Quick Evaluation

## 2014-08-07 LAB — GLUCOSE, CAPILLARY: GLUCOSE-CAPILLARY: 311 mg/dL — AB (ref 70–99)

## 2014-08-07 LAB — HEMOGLOBIN AND HEMATOCRIT, BLOOD
HCT: 32.6 % — ABNORMAL LOW (ref 39.0–52.0)
Hemoglobin: 11.3 g/dL — ABNORMAL LOW (ref 13.0–17.0)

## 2014-08-07 LAB — CREATININE, FLUID (PLEURAL, PERITONEAL, JP DRAINAGE): Creat, Fluid: 1.1 mg/dL

## 2014-08-07 MED ORDER — SENNOSIDES-DOCUSATE SODIUM 8.6-50 MG PO TABS: 1.0000 | ORAL_TABLET | Freq: Two times a day (BID) | ORAL | Status: DC

## 2014-08-07 NOTE — Progress Notes (Signed)
1 Day Post-Op Subjective: The patient is doing well.  No nausea or vomiting. Pain is adequately controlled.  Objective: Vital signs in last 24 hours: Temp:  [97.6 F (36.4 C)-98.4 F (36.9 C)] 97.8 F (36.6 C) (12/12 0622) Pulse Rate:  [75-124] 75 (12/12 0622) Resp:  [11-16] 16 (12/12 0622) BP: (106-127)/(63-84) 113/68 mmHg (12/12 0622) SpO2:  [92 %-100 %] 96 % (12/12 0622) Weight:  [102.513 kg (226 lb)] 102.513 kg (226 lb) (12/11 1914)  Intake/Output from previous day: 12/11 0701 - 12/12 0700 In: 4368.8 [I.V.:3368.8; IV Piggyback:1000] Out: 3120 [Urine:2655; Drains:165; Blood:300] Intake/Output this shift: Total I/O In: 463.8 [P.O.:320; I.V.:143.8] Out: 20 [Drains:20]  Physical Exam:  General: Alert and oriented. GI: Soft, Nondistended. Incisions: Dressings intact. Urine: Clear Extremities: Nontender, no erythema, no edema.  Lab Results:  Recent Labs  08/06/14 1659 08/07/14 0413  HGB 13.5 11.3*  HCT 38.8* 32.6*   No results for input(s): NA, K, CL, CO2, GLUCOSE, BUN, CREATININE, CALCIUM in the last 72 hours.    Assessment/Plan: POD# 1 s/p robotic prostatectomy.  1) SL IVF 2) Ambulate, Incentive spirometry 3) Transition to oral pain medication 4) D/C pelvic drain 5) Plan for likely discharge later today   LOS: 1 day   Louis Meckel W 08/07/2014, 11:54 AM

## 2014-08-07 NOTE — Anesthesia Postprocedure Evaluation (Signed)
  Anesthesia Post-op Note  Patient: [name]  Procedure(s) Performed: Procedure(s) (LRB): ROBOTIC ASSISTED LAPAROSCOPIC RADICAL PROSTATECTOMY WITH INDOCYANINE GREEN DYE (N/A) PELVIC LYMPH NODE DISSECTION (Bilateral)  Patient Location: PACU  Anesthesia Type: General  Level of Consciousness: awake and alert   Airway and Oxygen Therapy: Patient Spontanous Breathing  Post-op Pain: mild  Post-op Assessment: Post-op Vital signs reviewed, Patient's Cardiovascular Status Stable, Respiratory Function Stable, Patent Airway and No signs of Nausea or vomiting  Last Vitals:  Filed Vitals:   08/07/14 0622  BP: 113/68  Pulse: 75  Temp: 36.6 C  Resp: 16    Post-op Vital Signs: stable   Complications: No apparent anesthesia complications

## 2014-08-07 NOTE — Discharge Summary (Signed)
Date of admission: 08/06/2014  Date of discharge: 08/07/2014  Admission diagnosis: prostate cancer  Discharge diagnosis: as above, s/p RALP  Secondary diagnoses:  Patient Active Problem List   Diagnosis Date Noted  . Prostate cancer 08/06/2014  . Elevated PSA 04/05/2014  . Obesity (BMI 30-39.9) 03/01/2014  . Dyslipidemia 09/18/2012  . Type 2 diabetes mellitus 09/04/2012  . Hypertension 06/30/2012  . Gallstones 06/30/2012  . Hyperglycemia 06/30/2012    History and Physical: For full details, please see admission history and physical. Briefly, [name] is a 53 y.o. year old patient with prostate cancer who presents for robotic assisted lap prostatectomy.   Hospital Course: Patient tolerated the procedure well.  He was then transferred to the floor after an uneventful PACU stay.  His hospital course was uncomplicated.  On POD#1  he had met discharge criteria: was eating a regular diet, was up and ambulating independently,  pain was well controlled, and was ready to for discharge.   Laboratory values:   Recent Labs  08/06/14 1659 08/07/14 0413  HGB 13.5 11.3*  HCT 38.8* 32.6*   No results for input(s): NA, K, CL, CO2, GLUCOSE, BUN, CREATININE, CALCIUM in the last 72 hours. No results for input(s): LABPT, INR in the last 72 hours. No results for input(s): LABURIN in the last 72 hours. No results found for this or any previous visit.  Disposition: Home  Discharge instruction: The patient was instructed to be ambulatory but told to refrain from heavy lifting, strenuous activity, or driving.   Discharge medications:   Medication List    TAKE these medications        amLODipine 10 MG tablet  Commonly known as:  NORVASC  TAKE 1 TABLET (10 MG TOTAL) BY MOUTH DAILY.     BENICAR HCT 40-25 MG per tablet  Generic drug:  olmesartan-hydrochlorothiazide  TAKE 1 TABLET BY MOUTH DAILY.     ciprofloxacin 500 MG tablet  Commonly known as:  CIPRO  Take 1 tablet (500 mg  total) by mouth 2 (two) times daily. Start day prior to office visit for foley removal     fenofibrate 145 MG tablet  Commonly known as:  TRICOR  TAKE 1 TABLET (145 MG TOTAL) BY MOUTH DAILY.     freestyle lancets  Use as instructed  DX: 250.00     glucose blood test strip  Commonly known as:  FREESTYLE LITE  Use as instructed  DX: 250.00     HYDROcodone-acetaminophen 5-325 MG per tablet  Commonly known as:  NORCO  Take 1-2 tablets by mouth every 6 (six) hours as needed.     metFORMIN 500 MG tablet  Commonly known as:  GLUCOPHAGE  TAKE 2 TABLETS TWICE DAILY WITH MEALS.     senna-docusate 8.6-50 MG per tablet  Commonly known as:  Senokot-S  Take 1 tablet by mouth 2 (two) times daily.        Followup:      Follow-up Information    Follow up with Alexis Frock, MD On 08/16/2014.   Specialty:  Urology   Why:  at 8:45   Contact information:   [place] [address] [phone]

## 2014-08-07 NOTE — Op Note (Signed)
[name], [name]                ACCOUNT NO.:  [id number]  MEDICAL RECORD NO.:  [id number]  LOCATION:  22                         FACILITY:  [facility]  PHYSICIAN:  Alexis Frock, MD     DATE OF BIRTH:  June 11, 1961  DATE OF PROCEDURE: 08/06/2014 DATE OF DISCHARGE:                              OPERATIVE REPORT   DIAGNOSIS:  High-risk prostate cancer.  PROCEDURES: 1. Robotic-assisted laparoscopic radical prostatectomy. 2. Bilateral pelvic lymphadenectomy. 3. Injection indocyanine green dye for lymphangiography.  ASSISTANT:  Debbrah Alar, PA.  ESTIMATED BLOOD LOSS:  300 mL.  COMPLICATIONS:  None.  SPECIMENS: 1. Periprostatic fat. 2. Right external iliac lymph nodes. 3. Right obturator lymph nodes. 4. Left external iliac lymph nodes. 5. Left obturator lymph nodes. 6. Radical prostatectomy.  FINDINGS: 1. Excellent prostatic parenchymal fluorescence, but without any     obvious sentinel lymph node fluorescence within the pelvis. 2. Very densely adherent left prostatic pedicle in posterior plane,     this is highly worrisome for locally advanced disease.  DRAINS: 1. Jackson-Pratt drain to bulb suction. 2. Foley catheter to straight drain.  INDICATION:  [name] is a pleasant 53 year old gentleman who was found on workup of significantly elevated PSA greater than 60 to have high-risk prostate cancer, multifocal.  He underwent staging imaging with CT and bone scan that was unremarkable for any distant disease. Options were discussed for management including primary radiotherapy versus hormone therapy alone versus surveillance protocols versus at surgical extirpation with and without minimally invasive assistance and wished to proceed with the latter.  We had discussed extensively preoperatively that it was highly likely that this would be the first step in the multimodal treatment approach given his high-risk disease. Informed consent was obtained and placed in the medical  record.  PROCEDURE IN DETAIL:  The patient being [name], was verified. Procedure being robotic prostatectomy was confirmed.  Procedure was carried out.  Time-out was confirmed.  Intravenous antibiotics were administered.  General endotracheal anesthesia was introduced.  The patient was placed into a low lithotomy position and he has further fashioned on the operative table using 3-inch tape over his chest. Sequential compression devices were applied.  A test of steep Trendelenburg position was performed and he was found to be suitably positioned.  A sterile field was created by prepping and draping his infra-xiphoid abdomen using chlorhexidine gluconate, and his penis, perineum and proximal thighs using iodine.  Foley catheter was placed per urethra to straight drain.  Next, a high-flow low pressure pneumoperitoneum was obtained using Veress technique in the supraumbilical midline having passed the aspiration and drop test.  A 12- mm robotic camera port was placed in the same location.  Laparoscopic examination of the peritoneal cavity revealed no significant adhesions and no visceral injury.  Additional ports were then placed as follows; right paramedian 8-mm robotic port, right far lateral 12-mm assistant port, right paramedian 5-mm suction port, left paramedian 8-mm robotic port, left far lateral 8-mm robotic port.  Robot was docked and passed through electronic checks.  Initial attention was directed at development of the space of Retzius.  Incision was made lateral to the left mid-umbilical ligament from the midline towards  the area of the internal ring and coursing along the iliac vessels.  Vas deferens was encountered and ligated during this, engaged the bucket-handle to retraction medially.  The left bladder wall was dissected free of the pelvic sidewall towards the area of the endopelvic fascia.  A mirror image dissection was performed on the right side and  anterior attachments were taken down using cautery scissors.  This exposed the anterior base of the prostate, which was defatted, this was set aside for permanent pathology, labeled periprostatic fat.  Next, using a percutaneously placed spinal needle in the midline just above the pubic symphysis, a 0.2 mL of indocyanine green dye was injected into each lobe of the prostate using robotic guidance with intervening suction to prevent dye spillage as well as to aid in sentinel lymphangiography. Next, the endopelvic fascia was carefully swept away from the left side of the prostate in a base-to-apex orientation on both sides.  The patient did have accessory penile arteries entering the dorsal venous complex area and these were carefully swept away superiorly thus to maximally preserve these structures.  The dorsal venous complex was then controlled using vascular stapler taking great care to avoid membranous urethral injury, and this did not occur.  Then, approximately 10 minutes post dye injection, a near infrared fluoroscopy was performed of the pelvis.  Inspection of the pelvis under near infrared fluorescence vision revealed excellent fluorescence of the prostate itself; however, there was no obvious fluorescent lymphatic channel seen coursing over the bladder around the prostates or within the pelvic lymph node packets including the common iliac, external iliac, obturator or internal iliac areas on either side.    Thus, a standard template lymphadenectomy was performed bilaterally first on the right side of the external iliac lymph node group with a confines being the external iliac artery, vein, pelvic sidewall and the iliac bifurcation.  Lymphostasis was achieved with cold clips.  This was set aside, labeled right external iliac lymph nodes.  Next, the right obturator group was carefully dissected free with the confines being the right obturator nerve, pelvic sidewall and external  iliac vein.  Lymphostasis was achieved with cold clips.  This was set aside, labeled right obturator lymph nodes.  The obturator nerve was inspected following these maneuvers and found to be completely intact.  A mirror image lymphadenectomy was performed on the left side as per on the right, again at the left external iliac and left obturator packet respectively.  The left obturator nerve was also inspected following these maneuvers and found to be completely intact.  Attention was directed at the bladder neck dissection.  Bladder neck dissection was identifying by removing the Foley catheter back and forth and dissection in the anterior-posterior direction separating the bladder neck from the base of the prostate keeping what appeared to be a muscular fibres on each side to maximally preserve bladder neck function.  Posterior dissection was performed by incising approximately 7 mm posterior to the posterior lip of the prostate and dissecting directly inferoposteriorly.  The the plane of Denonvilliers was encountered.  However, it was incredibly stuck and adherent.  Bilateral vas deferens were encountered, dissected for distance of approximately 1 cm, transected and placed in gentle and superior traction.  These could not be dissected further due to their adherence to local structures. Multiple attempts were made to identify the area of the seminal vesicles.  However, they could not easily be identified given the very dense and wide prostatic pedicles, this was highly concerning  for locally advanced disease.  The posterior plane was developed further in the the plane of Denonvilliers in a base-to-apex orientation exposing the vascular pedicles and the prostate bilaterally.  The right one was mildly fixed, the left one was incredibly fixed, again worrisome for possible locally advanced disease.  The left pedicle was then controlled using sequential clipping technique in a base-to-apex  orientation as was the right pedicle.  Purposeful wide dissection was performed laterally and apically given high-risk disease and no nerve sparing was performed whatsoever.  Next, final apical dissection was performed in the anterior plane by identifying the membranous urethra and transecting at its midpoint.  This completely freed up the radical prostatectomy, specimen was placed into an EndoCatch bag for later retrieval.  Digital rectal exam was performed under laparoscopic vision using indicator glove and no rectal violation was encountered.  Next, posterior dissection was performed using a single V-Loc suture reapproximating the posterior urethral plate to the posterior bladder plate bringing these two structures into tension-free apposition.  Next, bladder neck to urethral stump, mucosal anastomosis was performed using double-armed V-Loc suture from the 6 o'clock to 12 o'clock position taking great care to ensure mucosa-to-mucosa anastomosis.  The ureteral orifices were identified and well away from this area.  An Esmarch was anchored to the puboprostatic ligament anteriorly thus performing anterior urethropexy.  At this point, all sponge and needle counts were correct.  Hemostasis was appeared excellent.  Closed suction drain was brought through the previous left lateral most robotic port site in the area of the peritoneal cavity.  The right 12-mm assistant port was closed at the level of fascia using a Carter-Thomason suture passer and Vicryl under laparoscopic vision.  Robot was then undocked.  Specimen was retrieved by extending the previous camera port site for total distance approximately 3 cm, removing the prostatectomy specimen and setting this aside for permanent pathology.  The extraction site was closed at the level of the fascia using figure-of-eight PDS x3 followed by Scarpa's reapproximation with running Vicryl.  All incisions were infiltrated with dilute lyophilized  Marcaine and closed at the level of the skin using subcuticular Monocryl followed by Dermabond.  Procedure was then terminated.  The patient tolerated the procedure well.  There were no immediate periprocedural complications.  The patient was taken to the Postanesthesia Care Unit in stable condition.          ______________________________ Alexis Frock, MD     TM/MEDQ  D:  08/06/2014  T:  08/07/2014  Job:  [id number]

## 2014-08-09 ENCOUNTER — Encounter (HOSPITAL_COMMUNITY): Payer: Self-pay | Admitting: Urology

## 2014-08-09 LAB — GLUCOSE, CAPILLARY: GLUCOSE-CAPILLARY: 235 mg/dL — AB (ref 70–99)

## 2014-08-09 NOTE — Progress Notes (Signed)
Utilization review completed.  

## 2014-08-16 ENCOUNTER — Other Ambulatory Visit: Payer: Self-pay | Admitting: Family Medicine

## 2014-09-01 ENCOUNTER — Ambulatory Visit: Payer: BC Managed Care – PPO | Admitting: Family Medicine

## 2014-09-24 ENCOUNTER — Encounter: Payer: Self-pay | Admitting: Radiation Oncology

## 2014-09-24 NOTE — Progress Notes (Signed)
   Prostatic adenocarcinoma  Gleason Score is (4 + 4=8) and PSA is (68)  Per [name], MD note on 8/10/20158:49 AM: Patient seen for recent elevated PSA. We obtained PSA of 62. He was not having any infectious or obstructive urinary symptoms. He was placed on ciprofloxacin which he took for 10 days. Repeat PSA last week 68. No obstructive symptoms. No dysuria. Left lobe of prostate is firm to palpation. There is no tenderness.   Past/Anticipated interventions by urology, if any:     Past/Anticipated interventions by medical oncology, if any: None  Weight changes, if any: negative for appetite change and unexpected weight change  Bowel/Bladder complaints, if any: stress urinary incontinence following prostatectomy and nocturia  Nausea/Vomiting, if any: NO  Pain issues, if any: right posterior pelvic pain   SAFETY ISSUES:  Prior radiation? no  Pacemaker/ICD? no  Possible current pregnancy? no  Is the patient on methotrexate? 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 30 g prostate volume by TRUS.

## 2014-09-26 NOTE — Progress Notes (Signed)
Radiation Oncology         [phone] ________________________________  Name: [name] MRN: [id number]  Date: 09/27/2014  DOB: December 15, 1960  Follow-Up Visit Note  CC: [name], MD  Alexis Frock, MD  Diagnosis:   54 yo man s/p radical prostatectomy for Gleason's 4+4 adenocarcinoma with one positive node, multiple focally positive margins, positive seminal vesicles and extraprostatic extension with a post-op PSA of 2.74    ICD-9-CM ICD-10-CM   1. Prostate cancer [place]    Narrative:  The patient returns today for follow-up.  I met him in consultation on 05/17/14.  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 He underwent prostatectomy on 08/06/14 and had the following pathology findings.   His PSA on 09/15/14 was 2.74.    He has kindly been referred today to discuss possible radiation treatment options.    ALLERGIES:  has No Known Allergies.  Meds: Current Outpatient Prescriptions  Medication Sig Dispense Refill  . amLODipine (NORVASC) 10 MG tablet TAKE 1 TABLET (10 MG TOTAL) BY MOUTH DAILY. 90 tablet 2  . BENICAR HCT 40-25 MG per tablet TAKE 1 TABLET BY MOUTH DAILY. 30 tablet 5  . fenofibrate (TRICOR) 145 MG tablet TAKE 1 TABLET (145 MG TOTAL) BY MOUTH DAILY. 30 tablet 5  . glucose blood (FREESTYLE LITE) test strip Use as instructed  DX: 250.00 100 each 3  . Lancets (FREESTYLE) lancets Use as instructed  DX: 250.00 100 each 3  . metFORMIN (GLUCOPHAGE) 500 MG tablet TAKE 2 TABLETS TWICE DAILY WITH MEALS. 120 tablet 2   No current facility-administered medications for this encounter.    Physical Findings: The patient is in no acute distress. Patient is alert and oriented.  height is '5\' 9"'  (1.753 m) and weight is 218 lb 14.4 oz (99.292 kg). His blood pressure is 109/57 and his pulse is 98. His respiration is 16. .  No significant changes.  Lab  Findings: Lab Results  Component Value Date   WBC 5.1 08/02/2014   HGB 11.3* 08/07/2014   HCT 32.6* 08/07/2014   MCV 89.1 08/02/2014   PLT 169 08/02/2014    Impression:  The patient has undergone radical prostatectomy for high-risk prostate cancer and was found to have a single lymph node positive. He also had other adverse pathology features including extracapsular extension, positive margins, and seminal vesicle involvement. These features all increase the patient's risk for local regional recurrence. He may benefit from adjuvant radiotherapy to the pelvis. Unfortunately, his PSA remains detectable following surgery at 2.74.  Given the high risk nature of this disease and detectable PSA, this patient may also benefit from concurrent androgen deprivation with radiotherapy.  Plan:  Today I reviewed the findings and workup thus far.  We discussed the natural history of prostate cancer.  We reviewed the the implications of positive margins, extracapsular extension, limited lymph node involvement and seminal vesicle involvement on the risk of prostate cancer recurrence. We reviewed some of the evidence suggesting an advantage for patients who undergo adjuvant radiotherapy in this setting in terms of disease control and overall survival. We also discussed some of the dilemmas related to the available evidence.  We discussed radiation treatment directed to the prostatic fossa with regard to the logistics and delivery of external beam radiation treatment.  I filled out a patient counseling form for him with relevant treatment diagrams  and we retained a copy for our records.   The patient would like to proceed with post-prostatectomy radiotherapy in conjunction with androgen deprivation.  I will share my findings with Dr. Tresa Moore and schedule CT simulation on 11/12/2014 to provide 4 months to maximize urinary continence postoperatively..     I enjoyed meeting with him today, and will look forward to  participating in the care of this very nice gentleman.  I spent 60 minutes face to face with the patient and more than 50% of that time was spent in counseling and/or coordination of care.   ------------------------------------------------  Sheral Apley. Tammi Klippel, M.D.

## 2014-09-27 ENCOUNTER — Ambulatory Visit
Admission: RE | Admit: 2014-09-27 | Discharge: 2014-09-27 | Disposition: A | Discharge status: Home / Self Care | Payer: BLUE CROSS/BLUE SHIELD | Source: Ambulatory Visit | Attending: Radiation Oncology | Admitting: Radiation Oncology

## 2014-09-27 ENCOUNTER — Encounter: Payer: Self-pay | Admitting: Radiation Oncology

## 2014-09-27 VITALS — BP 109/57 | HR 98 | Resp 16 | Ht 69.0 in | Wt 218.9 lb

## 2014-09-27 DIAGNOSIS — C61 Malignant neoplasm of prostate: Secondary | ICD-10-CM

## 2014-09-27 NOTE — Progress Notes (Signed)
See progress noted under physician encounter.  

## 2014-09-27 NOTE — Progress Notes (Signed)
Reports nocturia x2. Reports stress incontinence and that he has to wear a pad daily. Denies dysuria or hematuria. Denies weight loss. Denies bone pain. Denies night sweats. Vitals stable. 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

## 2014-11-12 ENCOUNTER — Ambulatory Visit: Payer: BLUE CROSS/BLUE SHIELD | Admitting: Radiation Oncology

## 2014-11-12 ENCOUNTER — Ambulatory Visit
Admission: RE | Admit: 2014-11-12 | Discharge: 2014-11-12 | Disposition: A | Discharge status: Home / Self Care | Payer: BLUE CROSS/BLUE SHIELD | Source: Ambulatory Visit | Attending: Radiation Oncology | Admitting: Radiation Oncology

## 2014-11-12 ENCOUNTER — Ambulatory Visit: Payer: Self-pay | Admitting: Radiation Oncology

## 2014-11-12 DIAGNOSIS — C61 Malignant neoplasm of prostate: Secondary | ICD-10-CM

## 2014-11-14 NOTE — Progress Notes (Signed)
  Radiation Oncology         [phone] ________________________________  Name: [name] MRN: [id number]  Date: 11/12/2014  DOB: 07-20-1961  SIMULATION AND TREATMENT PLANNING NOTE    ICD-9-CM ICD-10-CM   1. Prostate cancer 72 C61     DIAGNOSIS:  54 yo man s/p radical prostatectomy for Gleason's 4+4 adenocarcinoma with one positive node, multiple focally positive margins, positive seminal vesicles and extraprostatic extension with a post-op PSA of 2.74  NARRATIVE:  The patient was brought to the [facility].  Identity was confirmed.  All relevant records and images related to the planned course of therapy were reviewed.  The patient freely provided informed written consent to proceed with treatment after reviewing the details related to the planned course of therapy. The consent form was witnessed and verified by the simulation staff.  Then, the patient was set-up in a stable reproducible supine position for radiation therapy.  A vacuum lock pillow device was custom fabricated to position his legs in a reproducible immobilized position.  Then, I performed a urethrogram under sterile conditions to identify the prostatic apex.  CT images were obtained.  Surface markings were placed.  The CT images were loaded into the planning software.  Then the prostate target and avoidance structures including the rectum, bladder, bowel and hips were contoured.  Treatment planning then occurred.  The radiation prescription was entered and confirmed.  A total of one complex treatment device was fabricated. I have requested : Intensity Modulated Radiotherapy (IMRT) is medically necessary for this case for the following reason:  Rectal sparing.Marland Kitchen  PLAN:  The patient will receive 68.4 Gy in 38 fractions.  ________________________________  Sheral Apley Tammi Klippel, M.D.\

## 2014-11-17 DIAGNOSIS — C61 Malignant neoplasm of prostate: Secondary | ICD-10-CM | POA: Diagnosis not present

## 2014-11-19 DIAGNOSIS — C61 Malignant neoplasm of prostate: Secondary | ICD-10-CM | POA: Diagnosis not present

## 2014-11-23 ENCOUNTER — Ambulatory Visit
Admission: RE | Admit: 2014-11-23 | Discharge: 2014-11-23 | Disposition: A | Discharge status: Home / Self Care | Payer: BLUE CROSS/BLUE SHIELD | Source: Ambulatory Visit | Attending: Radiation Oncology | Admitting: Radiation Oncology

## 2014-11-23 DIAGNOSIS — C61 Malignant neoplasm of prostate: Secondary | ICD-10-CM | POA: Diagnosis not present

## 2014-11-24 ENCOUNTER — Ambulatory Visit
Admission: RE | Admit: 2014-11-24 | Discharge: 2014-11-24 | Disposition: A | Discharge status: Home / Self Care | Payer: BLUE CROSS/BLUE SHIELD | Source: Ambulatory Visit | Attending: Radiation Oncology | Admitting: Radiation Oncology

## 2014-11-24 DIAGNOSIS — C61 Malignant neoplasm of prostate: Secondary | ICD-10-CM | POA: Diagnosis not present

## 2014-11-25 ENCOUNTER — Ambulatory Visit
Admission: RE | Admit: 2014-11-25 | Discharge: 2014-11-25 | Disposition: A | Discharge status: Home / Self Care | Payer: BLUE CROSS/BLUE SHIELD | Source: Ambulatory Visit | Attending: Radiation Oncology | Admitting: Radiation Oncology

## 2014-11-25 DIAGNOSIS — C61 Malignant neoplasm of prostate: Secondary | ICD-10-CM | POA: Diagnosis not present

## 2014-11-26 ENCOUNTER — Ambulatory Visit
Admission: RE | Admit: 2014-11-26 | Discharge: 2014-11-26 | Disposition: A | Discharge status: Home / Self Care | Payer: BLUE CROSS/BLUE SHIELD | Source: Ambulatory Visit | Attending: Radiation Oncology | Admitting: Radiation Oncology

## 2014-11-26 ENCOUNTER — Encounter: Payer: Self-pay | Admitting: Radiation Oncology

## 2014-11-26 VITALS — BP 128/75 | HR 93 | Resp 16 | Wt 226.2 lb

## 2014-11-26 DIAGNOSIS — C61 Malignant neoplasm of prostate: Secondary | ICD-10-CM | POA: Diagnosis not present

## 2014-11-26 NOTE — Progress Notes (Signed)
   Weekly Management Note:  Outpatient    ICD-9-CM ICD-10-CM   1. Prostate cancer 185 C61     Current Dose:  7.2 Gy  Projected Dose: 68.4 Gy   Narrative:  The patient presents for routine under treatment assessment.  CBCT/MVCT images/Port film x-rays were reviewed.  The chart was checked. No new complaints. Urinary sx are stable. nocturia x 2  Physical Findings:  weight is 226 lb 3.2 oz (102.604 kg). His blood pressure is 128/75 and his pulse is 93. His respiration is 16.   Wt Readings from Last 3 Encounters:  08/06/14 226 lb (102.513 kg)  05/17/14 235 lb (106.595 kg)  04/05/14 235 lb (106.595 kg)   NAD  Impression:  The patient is tolerating radiotherapy.  Plan:  Continue radiotherapy as planned.    ________________________________   Eppie Gibson, M.D.

## 2014-11-26 NOTE — Progress Notes (Signed)
Reports nocturia x2. Reports stress incontinence is rare. Denies dysuria or hematuria. Denies weight loss. Denies bone pain. Denies night sweats. Vitals stable.   Oriented patient to staff and routine of the clinic. Provided patient with RADIATION THERAPY AND YOU handbook then, reviewed pertinent information. Educated patient reference potential side effects and management such as, fatigue, skin changes, diarrhea, and urinary bladder changes. Allowed patient opportunity to ask questions and answered those to the best of my ability. Patient understands to contact this RN with future needs.

## 2014-11-29 ENCOUNTER — Ambulatory Visit
Admission: RE | Admit: 2014-11-29 | Discharge: 2014-11-29 | Disposition: A | Discharge status: Home / Self Care | Payer: BLUE CROSS/BLUE SHIELD | Source: Ambulatory Visit | Attending: Radiation Oncology | Admitting: Radiation Oncology

## 2014-11-29 DIAGNOSIS — C61 Malignant neoplasm of prostate: Secondary | ICD-10-CM | POA: Diagnosis not present

## 2014-11-30 ENCOUNTER — Ambulatory Visit
Admission: RE | Admit: 2014-11-30 | Discharge: 2014-11-30 | Disposition: A | Discharge status: Home / Self Care | Payer: BLUE CROSS/BLUE SHIELD | Source: Ambulatory Visit | Attending: Radiation Oncology | Admitting: Radiation Oncology

## 2014-11-30 DIAGNOSIS — C61 Malignant neoplasm of prostate: Secondary | ICD-10-CM | POA: Diagnosis not present

## 2014-12-01 ENCOUNTER — Ambulatory Visit
Admission: RE | Admit: 2014-12-01 | Discharge: 2014-12-01 | Disposition: A | Discharge status: Home / Self Care | Payer: BLUE CROSS/BLUE SHIELD | Source: Ambulatory Visit | Attending: Radiation Oncology | Admitting: Radiation Oncology

## 2014-12-01 DIAGNOSIS — C61 Malignant neoplasm of prostate: Secondary | ICD-10-CM | POA: Diagnosis not present

## 2014-12-02 ENCOUNTER — Ambulatory Visit
Admission: RE | Admit: 2014-12-02 | Discharge: 2014-12-02 | Disposition: A | Discharge status: Home / Self Care | Payer: BLUE CROSS/BLUE SHIELD | Source: Ambulatory Visit | Attending: Radiation Oncology | Admitting: Radiation Oncology

## 2014-12-02 DIAGNOSIS — C61 Malignant neoplasm of prostate: Secondary | ICD-10-CM | POA: Diagnosis not present

## 2014-12-03 ENCOUNTER — Ambulatory Visit
Admission: RE | Admit: 2014-12-03 | Discharge: 2014-12-03 | Disposition: A | Discharge status: Home / Self Care | Payer: BLUE CROSS/BLUE SHIELD | Source: Ambulatory Visit | Attending: Radiation Oncology | Admitting: Radiation Oncology

## 2014-12-03 ENCOUNTER — Encounter: Payer: Self-pay | Admitting: Radiation Oncology

## 2014-12-03 VITALS — BP 121/68 | HR 90 | Resp 16 | Wt 226.4 lb

## 2014-12-03 DIAGNOSIS — C61 Malignant neoplasm of prostate: Secondary | ICD-10-CM

## 2014-12-03 NOTE — Progress Notes (Signed)
  Radiation Oncology         [phone] ________________________________  Name: [name] MRN: [id number]  Date: 12/03/2014  DOB: [birth year]  Weekly Radiation Therapy Management    ICD-9-CM ICD-10-CM   1. Prostate cancer 185 C61     Current Dose: 16.2 Gy     Planned Dose:  68.4 Gy  Narrative . . . . . . . . The patient presents for routine under treatment assessment.                                   Reports nocturia x2. Denies dysuria, hematuria or diarrhea. Reports stress incontinence is the same. Continues to wear one pad per day to absorb incontinence. Weight and vitals stable. Denis pain. Reports chronic problems sleeping which result in fatigue.                                  Set-up films were reviewed.                                 The chart was checked. Physical Findings. . .  weight is 226 lb 6.4 oz (102.694 kg). His blood pressure is 121/68 and his pulse is 90. His respiration is 16. . Weight essentially stable.  No significant changes. Impression . . . . . . . The patient is tolerating radiation. Plan . . . . . . . . . . . . Continue treatment as planned.   This document serves as a record of services personally performed by Tyler Pita, MD. It was created on his behalf by Pearlie Oyster, a trained medical scribe. The creation of this record is based on the scribe's personal observations and the provider's statements to them. This document has been checked and approved by the attending provider.     ________________________________  Sheral Apley. Tammi Klippel, M.D.

## 2014-12-03 NOTE — Progress Notes (Signed)
Reports nocturia x2. Denies dysuria, hematuria or diarrhea. Reports stress incontinence is the same. Continues to wear one pad per day to absorb incontinence. Weight and vitals stable. Denis pain. Reports chronic problems sleeping which result in fatigue.

## 2014-12-06 ENCOUNTER — Ambulatory Visit: Admission: RE | Admit: 2014-12-06 | Payer: BLUE CROSS/BLUE SHIELD | Source: Ambulatory Visit

## 2014-12-06 ENCOUNTER — Ambulatory Visit
Admission: RE | Admit: 2014-12-06 | Discharge: 2014-12-06 | Disposition: A | Discharge status: Home / Self Care | Payer: BLUE CROSS/BLUE SHIELD | Source: Ambulatory Visit | Attending: Radiation Oncology | Admitting: Radiation Oncology

## 2014-12-07 ENCOUNTER — Ambulatory Visit
Admission: RE | Admit: 2014-12-07 | Discharge: 2014-12-07 | Disposition: A | Discharge status: Home / Self Care | Payer: BLUE CROSS/BLUE SHIELD | Source: Ambulatory Visit | Attending: Radiation Oncology | Admitting: Radiation Oncology

## 2014-12-07 DIAGNOSIS — C61 Malignant neoplasm of prostate: Secondary | ICD-10-CM | POA: Diagnosis not present

## 2014-12-08 ENCOUNTER — Ambulatory Visit
Admission: RE | Admit: 2014-12-08 | Discharge: 2014-12-08 | Disposition: A | Discharge status: Home / Self Care | Payer: BLUE CROSS/BLUE SHIELD | Source: Ambulatory Visit | Attending: Radiation Oncology | Admitting: Radiation Oncology

## 2014-12-08 DIAGNOSIS — C61 Malignant neoplasm of prostate: Secondary | ICD-10-CM | POA: Diagnosis not present

## 2014-12-09 ENCOUNTER — Ambulatory Visit
Admission: RE | Admit: 2014-12-09 | Discharge: 2014-12-09 | Disposition: A | Discharge status: Home / Self Care | Payer: BLUE CROSS/BLUE SHIELD | Source: Ambulatory Visit | Attending: Radiation Oncology | Admitting: Radiation Oncology

## 2014-12-09 DIAGNOSIS — C61 Malignant neoplasm of prostate: Secondary | ICD-10-CM | POA: Diagnosis not present

## 2014-12-10 ENCOUNTER — Ambulatory Visit
Admission: RE | Admit: 2014-12-10 | Discharge: 2014-12-10 | Disposition: A | Discharge status: Home / Self Care | Payer: BLUE CROSS/BLUE SHIELD | Source: Ambulatory Visit | Attending: Radiation Oncology | Admitting: Radiation Oncology

## 2014-12-10 DIAGNOSIS — C61 Malignant neoplasm of prostate: Secondary | ICD-10-CM | POA: Diagnosis not present

## 2014-12-13 ENCOUNTER — Ambulatory Visit
Admission: RE | Admit: 2014-12-13 | Discharge: 2014-12-13 | Disposition: A | Discharge status: Home / Self Care | Payer: BLUE CROSS/BLUE SHIELD | Source: Ambulatory Visit | Attending: Radiation Oncology | Admitting: Radiation Oncology

## 2014-12-13 ENCOUNTER — Ambulatory Visit
Admission: RE | Admit: 2014-12-13 | Discharge: 2014-12-13 | Disposition: A | Discharge status: Home / Self Care | Payer: BLUE CROSS/BLUE SHIELD | Source: Ambulatory Visit | Admitting: Radiation Oncology

## 2014-12-13 VITALS — BP 109/69 | HR 85 | Temp 97.8°F | Wt 220.9 lb

## 2014-12-13 DIAGNOSIS — C61 Malignant neoplasm of prostate: Secondary | ICD-10-CM

## 2014-12-13 NOTE — Progress Notes (Signed)
Weekly assessment of radiation to pelvis.Completed 14 of 38 treatments.Denies pain.Has nocturia up to 4 times.Increased frequency of urination.States he has been having watery loose stools for about one week.Told to try imodium ad.

## 2014-12-13 NOTE — Progress Notes (Signed)
   Department of Radiation Oncology  Phone:  [phone] Fax:        [phone]  Weekly Treatment Note    Name: [name] Date: 12/13/2014 MRN: [id number] DOB: 03-12-1961   Current dose: 25.2 Gy  Current fraction: 14   MEDICATIONS: Current Outpatient Prescriptions  Medication Sig Dispense Refill  . amLODipine (NORVASC) 10 MG tablet TAKE 1 TABLET (10 MG TOTAL) BY MOUTH DAILY. 90 tablet 2  . BENICAR HCT 40-25 MG per tablet TAKE 1 TABLET BY MOUTH DAILY. 30 tablet 5  . fenofibrate (TRICOR) 145 MG tablet TAKE 1 TABLET (145 MG TOTAL) BY MOUTH DAILY. 30 tablet 5  . glucose blood (FREESTYLE LITE) test strip Use as instructed  DX: 250.00 100 each 3  . Lancets (FREESTYLE) lancets Use as instructed  DX: 250.00 100 each 3  . metFORMIN (GLUCOPHAGE) 500 MG tablet TAKE 2 TABLETS TWICE DAILY WITH MEALS. 120 tablet 2   No current facility-administered medications for this encounter.     ALLERGIES: Review of patient's allergies indicates no known allergies.   LABORATORY DATA:  Lab Results  Component Value Date   WBC 5.1 08/02/2014   HGB 11.3* 08/07/2014   HCT 32.6* 08/07/2014   MCV 89.1 08/02/2014   PLT 169 08/02/2014   Lab Results  Component Value Date   NA 134* 08/02/2014   K 4.1 08/02/2014   CL 94* 08/02/2014   CO2 26 08/02/2014   Lab Results  Component Value Date   ALT 34 11/25/2012   AST 24 11/25/2012   ALKPHOS 27* 11/25/2012   BILITOT 0.8 11/25/2012     NARRATIVE: [name] was seen today for weekly treatment management. The chart was checked and the patient's films were reviewed.  Weekly assessment of radiation to pelvis.Completed 14 of 38 treatments.Denies pain.Has nocturia up to 4 times.Increased frequency of urination.States he has been having watery loose stools for about one week.Told to try imodium ad.  PHYSICAL EXAMINATION: weight is 220 lb 14.4 oz (100.2 kg). His temperature is 97.8 F (36.6 C). His blood pressure is 109/69 and his pulse is  85. His oxygen saturation is 98%.        ASSESSMENT: The patient is doing satisfactorily with treatment.  PLAN: We will continue with the patient's radiation treatment as planned.

## 2014-12-14 ENCOUNTER — Ambulatory Visit
Admission: RE | Admit: 2014-12-14 | Discharge: 2014-12-14 | Disposition: A | Discharge status: Home / Self Care | Payer: BLUE CROSS/BLUE SHIELD | Source: Ambulatory Visit | Attending: Radiation Oncology | Admitting: Radiation Oncology

## 2014-12-14 DIAGNOSIS — C61 Malignant neoplasm of prostate: Secondary | ICD-10-CM | POA: Diagnosis not present

## 2014-12-15 ENCOUNTER — Telehealth: Payer: Self-pay | Admitting: Radiation Oncology

## 2014-12-15 ENCOUNTER — Ambulatory Visit
Admission: RE | Admit: 2014-12-15 | Discharge: 2014-12-15 | Disposition: A | Discharge status: Home / Self Care | Payer: BLUE CROSS/BLUE SHIELD | Source: Ambulatory Visit | Attending: Radiation Oncology | Admitting: Radiation Oncology

## 2014-12-15 ENCOUNTER — Encounter: Payer: Self-pay | Admitting: Radiation Oncology

## 2014-12-15 VITALS — BP 113/78 | HR 97 | Temp 98.2°F | Ht 69.0 in | Wt 225.2 lb

## 2014-12-15 DIAGNOSIS — C61 Malignant neoplasm of prostate: Secondary | ICD-10-CM

## 2014-12-15 NOTE — Telephone Encounter (Signed)
Phone patient to inform him of PUT appointment for today instead of Friday. No answer. Left message detailing such.

## 2014-12-15 NOTE — Progress Notes (Signed)
  Radiation Oncology         [phone] ________________________________  Name: [name] MRN: [id number]  Date: 12/15/2014  DOB: 01/22/1961  Weekly Radiation Therapy Management    ICD-9-CM ICD-10-CM   1. Prostate cancer 185 C61     Current Dose: 28.8 Gy     Planned Dose:  68.4 Gy  Narrative . . . . . . . . The patient presents for routine under treatment assessment.                                   Phone patient to inform him of PUT appointment for today instead of Friday. No answer. Left message detailing such.                                  Set-up films were reviewed.                                 The chart was checked. Physical Findings. . .  height is 5\' 9"  (1.753 m) and weight is 225 lb 3.2 oz (102.15 kg). His temperature is 98.2 F (36.8 C). His blood pressure is 113/78 and his pulse is 97. . Weight essentially stable.  No significant changes. Impression . . . . . . . The patient is tolerating radiation. Plan . . . . . . . . . . . . Continue treatment as planned.  ________________________________  Sheral Apley. Tammi Klippel, M.D.

## 2014-12-15 NOTE — Progress Notes (Signed)
Presently, Mr. [name] reports 6 large, watery stools within the past 24 hours, but no rectal irritation at this time..  Encouraged to take Immodium, if permissible per Dr. Tammi Klippel.  VSS, sitting and standing.  Reports burning upon urination.  He reports complete emptying of bladder with each void and nocturia x 4.  States fatigue is unchanged from his normal.

## 2014-12-16 ENCOUNTER — Ambulatory Visit
Admission: RE | Admit: 2014-12-16 | Discharge: 2014-12-16 | Disposition: A | Discharge status: Home / Self Care | Payer: BLUE CROSS/BLUE SHIELD | Source: Ambulatory Visit | Attending: Radiation Oncology | Admitting: Radiation Oncology

## 2014-12-16 DIAGNOSIS — C61 Malignant neoplasm of prostate: Secondary | ICD-10-CM | POA: Diagnosis not present

## 2014-12-17 ENCOUNTER — Encounter: Payer: Self-pay | Admitting: Radiation Oncology

## 2014-12-17 ENCOUNTER — Ambulatory Visit
Admission: RE | Admit: 2014-12-17 | Discharge: 2014-12-17 | Disposition: A | Discharge status: Home / Self Care | Payer: BLUE CROSS/BLUE SHIELD | Source: Ambulatory Visit | Attending: Radiation Oncology | Admitting: Radiation Oncology

## 2014-12-17 DIAGNOSIS — C61 Malignant neoplasm of prostate: Secondary | ICD-10-CM | POA: Diagnosis not present

## 2014-12-20 ENCOUNTER — Ambulatory Visit
Admission: RE | Admit: 2014-12-20 | Discharge: 2014-12-20 | Disposition: A | Discharge status: Home / Self Care | Payer: BLUE CROSS/BLUE SHIELD | Source: Ambulatory Visit | Attending: Radiation Oncology | Admitting: Radiation Oncology

## 2014-12-20 DIAGNOSIS — C61 Malignant neoplasm of prostate: Secondary | ICD-10-CM | POA: Diagnosis not present

## 2014-12-21 ENCOUNTER — Ambulatory Visit
Admission: RE | Admit: 2014-12-21 | Discharge: 2014-12-21 | Disposition: A | Discharge status: Home / Self Care | Payer: BLUE CROSS/BLUE SHIELD | Source: Ambulatory Visit | Attending: Radiation Oncology | Admitting: Radiation Oncology

## 2014-12-21 DIAGNOSIS — C61 Malignant neoplasm of prostate: Secondary | ICD-10-CM | POA: Diagnosis not present

## 2014-12-22 ENCOUNTER — Ambulatory Visit (INDEPENDENT_AMBULATORY_CARE_PROVIDER_SITE_OTHER): Payer: BLUE CROSS/BLUE SHIELD | Admitting: Adult Health

## 2014-12-22 ENCOUNTER — Encounter: Payer: Self-pay | Admitting: Adult Health

## 2014-12-22 ENCOUNTER — Ambulatory Visit
Admission: RE | Admit: 2014-12-22 | Discharge: 2014-12-22 | Disposition: A | Discharge status: Home / Self Care | Payer: BLUE CROSS/BLUE SHIELD | Source: Ambulatory Visit | Attending: Radiation Oncology | Admitting: Radiation Oncology

## 2014-12-22 VITALS — Temp 98.2°F | Wt 220.5 lb

## 2014-12-22 DIAGNOSIS — R739 Hyperglycemia, unspecified: Secondary | ICD-10-CM

## 2014-12-22 DIAGNOSIS — R7309 Other abnormal glucose: Secondary | ICD-10-CM | POA: Diagnosis not present

## 2014-12-22 DIAGNOSIS — C61 Malignant neoplasm of prostate: Secondary | ICD-10-CM | POA: Diagnosis not present

## 2014-12-22 LAB — BASIC METABOLIC PANEL
BUN: 21 mg/dL (ref 6–23)
CO2: 30 meq/L (ref 19–32)
Calcium: 10 mg/dL (ref 8.4–10.5)
Chloride: 99 mEq/L (ref 96–112)
Creatinine, Ser: 1.01 mg/dL (ref 0.40–1.50)
GFR: 81.14 mL/min (ref >60.00)
Glucose, Bld: 160 mg/dL — ABNORMAL HIGH (ref 70–99)
Potassium: 3.4 mEq/L — ABNORMAL LOW (ref 3.5–5.1)
Sodium: 134 mEq/L — ABNORMAL LOW (ref 135–145)

## 2014-12-22 LAB — HEMOGLOBIN A1C: Hgb A1c MFr Bld: 7.1 % — ABNORMAL HIGH (ref 4.6–6.5)

## 2014-12-22 LAB — GLUCOSE, POCT (MANUAL RESULT ENTRY): POC GLUCOSE: 172 mg/dL — AB (ref 70–99)

## 2014-12-22 MED ORDER — ONDANSETRON 4 MG PO TBDP: 4.0000 mg | ORAL_TABLET | Freq: Three times a day (TID) | ORAL | Status: DC | PRN

## 2014-12-22 NOTE — Progress Notes (Signed)
Pre visit review using our clinic review tool, if applicable. No additional management support is needed unless otherwise documented below in the visit note. 

## 2014-12-22 NOTE — Progress Notes (Signed)
   Subjective:    Patient ID: [name], male    DOB: 07-Oct-1960, 54 y.o.   MRN: [id number]  HPI  [name] presents to the office today for hyperglycemia. He reports that his blood sugars have been close to the 300. This morning when he work up it was 285. He has not been checking his blood sugars recently but has been taking his Metformin daily. He endorses dizziness, feeling fatigued, frequent urination and nausea. Denies constant thirst. He is currently doing radiation and hormone therapy for prostate cancer ( 20 out of 38 appointments complete).   Review of Systems  Constitutional: Positive for fever, activity change, appetite change and fatigue. Negative for chills, diaphoresis and unexpected weight change.  HENT: Negative.   Respiratory: Negative for choking, chest tightness, shortness of breath and wheezing.   Cardiovascular: Negative.   Gastrointestinal: Negative.   Endocrine: Negative for polydipsia, polyphagia and polyuria.  Genitourinary: Positive for urgency and frequency.  Musculoskeletal: Negative.   Skin: Negative.   Neurological: Positive for dizziness, weakness and light-headedness.  Hematological: Negative.   Psychiatric/Behavioral: Negative.   All other systems reviewed and are negative.      Objective:   Physical Exam  Constitutional: He is oriented to person, place, and time. He appears well-developed and well-nourished. No distress.  Tired   Cardiovascular: Normal rate, regular rhythm and normal heart sounds.  Exam reveals no gallop and no friction rub.   No murmur heard. Pulmonary/Chest: Effort normal and breath sounds normal. No respiratory distress. He has no wheezes. He has no rales. He exhibits no tenderness.  Musculoskeletal: Normal range of motion.  Lymphadenopathy:    He has no cervical adenopathy.  Neurological: He is alert and oriented to person, place, and time.  Skin: Skin is warm and dry. No rash noted. He is not diaphoretic. No erythema.  No pallor.  Psychiatric: He has a normal mood and affect. His behavior is normal. Judgment and thought content normal.  Nursing note and vitals reviewed.      Assessment & Plan:  1. Elevated blood sugar - POCT glucose (manual entry) - Hemoglobin Y6R - Basic metabolic panel - Urinalysis, Routine w reflex microscopic - Microalbumin / creatinine urine ratio - Will speak to PCP and update.  - No change in medication regimen at this time  - Diabetic Diet - Will update with lab results an plan.

## 2014-12-22 NOTE — Patient Instructions (Addendum)
I will let you know the results of your labs and what Dr. Elease Hashimoto would like to do. In the mean time, check your blood sugars three times a day and record them. Continue to take your Metformin and follow a diabetic diet. I have sent Zofran to the pharmacy for you to take as needed for nausea. If you have any concerns, please let me know. I wish you the best in your last 18 treatments.   Hyperglycemia Hyperglycemia occurs when the glucose (sugar) in your blood is too high. Hyperglycemia can happen for many reasons, but it most often happens to people who do not know they have diabetes or are not managing their diabetes properly.  CAUSES  Whether you have diabetes or not, there are other causes of hyperglycemia. Hyperglycemia can occur when you have diabetes, but it can also occur in other situations that you might not be as aware of, such as: Diabetes  If you have diabetes and are having problems controlling your blood glucose, hyperglycemia could occur because of some of the following reasons:  Not following your meal plan.  Not taking your diabetes medications or not taking it properly.  Exercising less or doing less activity than you normally do.  Being sick. Pre-diabetes  This cannot be ignored. Before people develop Type 2 diabetes, they almost always have "pre-diabetes." This is when your blood glucose levels are higher than normal, but not yet high enough to be diagnosed as diabetes. Research has shown that some long-term damage to the body, especially the heart and circulatory system, may already be occurring during pre-diabetes. If you take action to manage your blood glucose when you have pre-diabetes, you may delay or prevent Type 2 diabetes from developing. Stress  If you have diabetes, you may be "diet" controlled or on oral medications or insulin to control your diabetes. However, you may find that your blood glucose is higher than usual in the hospital whether you have diabetes  or not. This is often referred to as "stress hyperglycemia." Stress can elevate your blood glucose. This happens because of hormones put out by the body during times of stress. If stress has been the cause of your high blood glucose, it can be followed regularly by your caregiver. That way he/she can make sure your hyperglycemia does not continue to get worse or progress to diabetes. Steroids  Steroids are medications that act on the infection fighting system (immune system) to block inflammation or infection. One side effect can be a rise in blood glucose. Most people can produce enough extra insulin to allow for this rise, but for those who cannot, steroids make blood glucose levels go even higher. It is not unusual for steroid treatments to "uncover" diabetes that is developing. It is not always possible to determine if the hyperglycemia will go away after the steroids are stopped. A special blood test called an A1c is sometimes done to determine if your blood glucose was elevated before the steroids were started. SYMPTOMS  Thirsty.  Frequent urination.  Dry mouth.  Blurred vision.  Tired or fatigue.  Weakness.  Sleepy.  Tingling in feet or leg. DIAGNOSIS  Diagnosis is made by monitoring blood glucose in one or all of the following ways:  A1c test. This is a chemical found in your blood.  Fingerstick blood glucose monitoring.  Laboratory results. TREATMENT  First, knowing the cause of the hyperglycemia is important before the hyperglycemia can be treated. Treatment may include, but is not be limited  to:  Education.  Change or adjustment in medications.  Change or adjustment in meal plan.  Treatment for an illness, infection, etc.  More frequent blood glucose monitoring.  Change in exercise plan.  Decreasing or stopping steroids.  Lifestyle changes. HOME CARE INSTRUCTIONS   Test your blood glucose as directed.  Exercise regularly. Your caregiver will give you  instructions about exercise. Pre-diabetes or diabetes which comes on with stress is helped by exercising.  Eat wholesome, balanced meals. Eat often and at regular, fixed times. Your caregiver or nutritionist will give you a meal plan to guide your sugar intake.  Being at an ideal weight is important. If needed, losing as little as 10 to 15 pounds may help improve blood glucose levels. SEEK MEDICAL CARE IF:   You have questions about medicine, activity, or diet.  You continue to have symptoms (problems such as increased thirst, urination, or weight gain). SEEK IMMEDIATE MEDICAL CARE IF:   You are vomiting or have diarrhea.  Your breath smells fruity.  You are breathing faster or slower.  You are very sleepy or incoherent.  You have numbness, tingling, or pain in your feet or hands.  You have chest pain.  Your symptoms get worse even though you have been following your caregiver's orders.  If you have any other questions or concerns. Document Released: 02/06/2001 Document Revised: 11/05/2011 Document Reviewed: 12/10/2011 [facility] Patient Information 2015 [place], [place]. This information is not intended to replace advice given to you by your health care provider. Make sure you discuss any questions you have with your health care provider.

## 2014-12-23 ENCOUNTER — Emergency Department (HOSPITAL_COMMUNITY)
Admission: EM | Admit: 2014-12-23 | Discharge: 2014-12-24 | Disposition: A | Discharge status: Home / Self Care | Payer: BLUE CROSS/BLUE SHIELD | Attending: Emergency Medicine | Admitting: Emergency Medicine

## 2014-12-23 ENCOUNTER — Ambulatory Visit
Admission: RE | Admit: 2014-12-23 | Discharge: 2014-12-23 | Disposition: A | Discharge status: Home / Self Care | Payer: BLUE CROSS/BLUE SHIELD | Source: Ambulatory Visit | Attending: Radiation Oncology | Admitting: Radiation Oncology

## 2014-12-23 ENCOUNTER — Other Ambulatory Visit: Payer: Self-pay | Admitting: Adult Health

## 2014-12-23 ENCOUNTER — Encounter (HOSPITAL_COMMUNITY): Payer: Self-pay | Admitting: *Deleted

## 2014-12-23 DIAGNOSIS — E119 Type 2 diabetes mellitus without complications: Secondary | ICD-10-CM | POA: Insufficient documentation

## 2014-12-23 DIAGNOSIS — Z8546 Personal history of malignant neoplasm of prostate: Secondary | ICD-10-CM | POA: Diagnosis not present

## 2014-12-23 DIAGNOSIS — K439 Ventral hernia without obstruction or gangrene: Secondary | ICD-10-CM | POA: Diagnosis not present

## 2014-12-23 DIAGNOSIS — Z79899 Other long term (current) drug therapy: Secondary | ICD-10-CM | POA: Insufficient documentation

## 2014-12-23 DIAGNOSIS — K8020 Calculus of gallbladder without cholecystitis without obstruction: Secondary | ICD-10-CM | POA: Insufficient documentation

## 2014-12-23 DIAGNOSIS — R109 Unspecified abdominal pain: Secondary | ICD-10-CM | POA: Diagnosis present

## 2014-12-23 DIAGNOSIS — Z87891 Personal history of nicotine dependence: Secondary | ICD-10-CM | POA: Diagnosis not present

## 2014-12-23 DIAGNOSIS — I10 Essential (primary) hypertension: Secondary | ICD-10-CM | POA: Diagnosis not present

## 2014-12-23 DIAGNOSIS — R112 Nausea with vomiting, unspecified: Secondary | ICD-10-CM

## 2014-12-23 DIAGNOSIS — C61 Malignant neoplasm of prostate: Secondary | ICD-10-CM | POA: Diagnosis not present

## 2014-12-23 DIAGNOSIS — R197 Diarrhea, unspecified: Secondary | ICD-10-CM

## 2014-12-23 LAB — MICROALBUMIN / CREATININE URINE RATIO
CREATININE, U: 156.8 mg/dL
MICROALB UR: 0.8 mg/dL (ref 0.0–1.9)
Microalb Creat Ratio: 0.5 mg/g (ref 0.0–30.0)

## 2014-12-23 LAB — CBC WITH DIFFERENTIAL/PLATELET
BASOS ABS: 0 10*3/uL (ref 0.0–0.1)
Basophils Relative: 0 % (ref 0–1)
EOS PCT: 3 % (ref 0–5)
Eosinophils Absolute: 0.1 10*3/uL (ref 0.0–0.7)
HCT: 33.9 % — ABNORMAL LOW (ref 39.0–52.0)
Hemoglobin: 12.2 g/dL — ABNORMAL LOW (ref 13.0–17.0)
Lymphocytes Relative: 20 % (ref 12–46)
Lymphs Abs: 0.8 10*3/uL (ref 0.7–4.0)
MCH: 32.1 pg (ref 26.0–34.0)
MCHC: 36 g/dL (ref 30.0–36.0)
MCV: 89.2 fL (ref 78.0–100.0)
MONO ABS: 0.3 10*3/uL (ref 0.1–1.0)
Monocytes Relative: 8 % (ref 3–12)
Neutro Abs: 2.8 10*3/uL (ref 1.7–7.7)
Neutrophils Relative %: 69 % (ref 43–77)
Platelets: 173 10*3/uL (ref 150–400)
RBC: 3.8 MIL/uL — ABNORMAL LOW (ref 4.22–5.81)
RDW: 14.1 % (ref 11.5–15.5)
WBC: 4 10*3/uL (ref 4.0–10.5)

## 2014-12-23 LAB — URINALYSIS, ROUTINE W REFLEX MICROSCOPIC
BILIRUBIN URINE: NEGATIVE
BILIRUBIN URINE: NEGATIVE
GLUCOSE, UA: NEGATIVE mg/dL
HGB URINE DIPSTICK: NEGATIVE
Hgb urine dipstick: NEGATIVE
KETONES UR: NEGATIVE mg/dL
Ketones, ur: NEGATIVE
Leukocytes, UA: NEGATIVE
Leukocytes, UA: NEGATIVE
Nitrite: NEGATIVE
Nitrite: NEGATIVE
PH: 5.5 (ref 5.0–8.0)
PROTEIN: NEGATIVE mg/dL
RBC / HPF: NONE SEEN (ref 0–?)
Specific Gravity, Urine: 1.025 (ref 1.000–1.030)
Specific Gravity, Urine: 1.028 (ref 1.005–1.030)
TOTAL PROTEIN, URINE-UPE24: NEGATIVE
URINE GLUCOSE: NEGATIVE
UROBILINOGEN UA: 0.2 (ref 0.0–1.0)
Urobilinogen, UA: 0.2 mg/dL (ref 0.0–1.0)
WBC, UA: NONE SEEN (ref 0–?)
pH: 6 (ref 5.0–8.0)

## 2014-12-23 LAB — COMPREHENSIVE METABOLIC PANEL
ALK PHOS: 34 U/L — AB (ref 39–117)
ALT: 41 U/L (ref 0–53)
ANION GAP: 10 (ref 5–15)
AST: 30 U/L (ref 0–37)
Albumin: 5 g/dL (ref 3.5–5.2)
BUN: 21 mg/dL (ref 6–23)
CALCIUM: 10.3 mg/dL (ref 8.4–10.5)
CO2: 28 mmol/L (ref 19–32)
Chloride: 99 mmol/L (ref 96–112)
Creatinine, Ser: 0.99 mg/dL (ref 0.50–1.35)
GFR, EST NON AFRICAN AMERICAN: 90 mL/min — AB (ref >90)
GLUCOSE: 131 mg/dL — AB (ref 70–99)
Potassium: 3.6 mmol/L (ref 3.5–5.1)
SODIUM: 137 mmol/L (ref 135–145)
Total Bilirubin: 1.3 mg/dL — ABNORMAL HIGH (ref 0.3–1.2)
Total Protein: 7.5 g/dL (ref 6.0–8.3)

## 2014-12-23 LAB — I-STAT TROPONIN, ED: Troponin i, poc: 0 ng/mL (ref 0.00–0.08)

## 2014-12-23 LAB — LIPASE, BLOOD: Lipase: 28 U/L (ref 11–59)

## 2014-12-23 MED ORDER — GLIPIZIDE 5 MG PO TABS: 2.5000 mg | ORAL_TABLET | Freq: Two times a day (BID) | ORAL | Status: DC

## 2014-12-23 MED ORDER — SODIUM CHLORIDE 0.9 % IV SOLN
INTRAVENOUS | Status: DC
  Administered 2014-12-24: 01:00:00 via INTRAVENOUS

## 2014-12-23 MED ORDER — SODIUM CHLORIDE 0.9 % IV BOLUS (SEPSIS)
500.0000 mL | Freq: Once | INTRAVENOUS | Status: AC
  Administered 2014-12-24: 500 mL via INTRAVENOUS

## 2014-12-23 NOTE — ED Notes (Signed)
Pt c/o lung / abd pain since 5pm greater on the rt side than the left; pt states that he radiation around 3:30 this afternoon and pt states that he usually does ok with treatments but feels worse this evening more than usual; pt also c/o "bulge" to above umbilicus that was not previously there; pt c/o nausea with no vomiting; pt started on diabetes medications today; pt c/o generalized weakness and shortness of breath; pt states that he feels like he needs to take a deeper breathe ever few breaths

## 2014-12-24 ENCOUNTER — Ambulatory Visit
Admission: RE | Admit: 2014-12-24 | Discharge: 2014-12-24 | Disposition: A | Discharge status: Home / Self Care | Payer: BLUE CROSS/BLUE SHIELD | Source: Ambulatory Visit | Attending: Radiation Oncology | Admitting: Radiation Oncology

## 2014-12-24 ENCOUNTER — Encounter: Payer: Self-pay | Admitting: Radiation Oncology

## 2014-12-24 ENCOUNTER — Encounter (HOSPITAL_COMMUNITY): Payer: Self-pay

## 2014-12-24 ENCOUNTER — Emergency Department (HOSPITAL_COMMUNITY): Payer: BLUE CROSS/BLUE SHIELD

## 2014-12-24 VITALS — BP 99/62 | HR 78 | Resp 16 | Wt 223.3 lb

## 2014-12-24 DIAGNOSIS — C61 Malignant neoplasm of prostate: Secondary | ICD-10-CM | POA: Diagnosis not present

## 2014-12-24 MED ORDER — ONDANSETRON 4 MG PO TBDP: 4.0000 mg | ORAL_TABLET | Freq: Three times a day (TID) | ORAL | Status: DC | PRN

## 2014-12-24 MED ORDER — IOHEXOL 300 MG/ML  SOLN
100.0000 mL | Freq: Once | INTRAMUSCULAR | Status: AC | PRN
  Administered 2014-12-24: 100 mL via INTRAVENOUS

## 2014-12-24 MED ORDER — HYDROCODONE-ACETAMINOPHEN 5-325 MG PO TABS: 2.0000 | ORAL_TABLET | Freq: Four times a day (QID) | ORAL | Status: DC | PRN

## 2014-12-24 MED ORDER — IOHEXOL 300 MG/ML  SOLN
50.0000 mL | Freq: Once | INTRAMUSCULAR | Status: AC | PRN
  Administered 2014-12-24: 25 mL via ORAL

## 2014-12-24 NOTE — Progress Notes (Addendum)
Patient presented to the ED on 12/23/2014 with right sided abdominal pain, nausea, vomiting, diarrhea. Patient was found to have a central hernia without obstruction and gallstones. Patient was discharged and advised to follow up [facility] and PCP. Patient reports he saw his PCP today, his blood sugar was elevated and his medication changed. Reports since Sunday he has felt weak and experience SOB. Reports intermittent nausea continues but, has script for Zofran. Reports diarrhea continues. Denies dysuria or hematuria. Reports nocturia x1 if at all. Describes a strong steady urine stream. Reports leakage is better controlled and rarely happens now. BP low. Reports PCP is considering stopping his Benicar.

## 2014-12-24 NOTE — Progress Notes (Addendum)
  Radiation Oncology         [phone] ________________________________  Name: [name] MRN: [id number]  Date: 12/24/2014  DOB: [birth year]  Weekly Radiation Therapy Management  No diagnosis found.  Current Dose: 41.4 Gy     Planned Dose:  68.4 Gy  Narrative . . . . . . . . The patient presents for routine under treatment assessment. Patient presented to the ED on 12/23/2014 with right sided abdominal pain, nausea, vomiting, diarrhea, and weakness. Patient was found to have a central hernia without obstruction and gallstones. Patient was discharged and advised to follow up [facility] and PCP. Patient reports he saw his PCP today, his blood sugar was elevated and his medication changed. Reports since Sunday he has felt weak and experience SOB. Reports intermittent nausea continues but, has script for Zofran. Reports diarrhea continues. Denies dysuria or hematuria. Reports nocturia x1 if at all. Describes a strong steady urine stream. Reports leakage is better controlled and rarely happens now. BP low. Reports PCP is considering stopping his Benicar. Pt has a prior hx of gallstones. Pt is mildly anemic                                  Set-up films were reviewed.                                 The chart was checked. Physical Findings. . .  weight is 223 lb 4.8 oz (101.288 kg). His blood pressure is 99/62 and his pulse is 78. His respiration is 16. . Weight essentially stable.  No significant changes. Pt appears pale.  Results for [name], [name] (MRN [id number]) as of 12/24/2014 17:04  Ref. Range 08/02/2014 08:30 08/06/2014 16:59 08/07/2014 04:13 12/23/2014 21:54  Hemoglobin Latest Ref Range: 13.0-17.0 g/dL 14.0 13.5 11.3 (L) 12.2 (L)     Impression . . . . . . . The patient is tolerating radiation. The symptoms felt could be attributed to the central hernia and not the radiation. The general weakness could be attributed to the radiation. Advised the pt to drink plenty of  fluids. Advised the pt to take otc antiacids with antigas agents (such as GasX) to reduce gaseous activity in the bowels. Plan . . . . . . . . . . . . Continue treatment as planned.  This document serves as a record of services personally performed by Derek Pita, MD. It was created on his behalf by Darcus Austin, a trained medical scribe. The creation of this record is based on the scribe's personal observations and the provider's statements to them. This document has been checked and approved by the attending provider.     ________________________________  Sheral Apley. Tammi Klippel, M.D.

## 2014-12-24 NOTE — Discharge Instructions (Signed)
Drink plenty of fluids. Uses Zofran for nausea or vomiting as needed. Keep your appointment for radiation today and discuss your abdominal pain with Dr. Tammi Klippel. You do have gallstones without inflammation of your gallbladder. You also have a new ventral hernia after your surgery. If the hernia stays swollen, not just when you are straining, or gets painful you need to be seen again in the emergency department. Otherwise you can have it evaluated by [facility] surgery who can also evaluate your gallstones. Return to the emergency department if you feel worse.

## 2014-12-24 NOTE — ED Provider Notes (Signed)
CSN: [id number]     Arrival date & time 12/23/14  2121 History   First MD Initiated Contact with Patient 12/23/14 2302     Chief Complaint  Patient presents with  . Abdominal Pain     (Consider location/radiation/quality/duration/timing/severity/associated sxs/prior Treatment) HPI  Patient reports he has been treated for prostate cancer. He had DiVinci surgery on December 11. He has been undergoing radiation treatment and has had 22 treatments. He states he still has 16 to go. He states that 5 days ago he started feeling weak all over. He did go to work the next 2 days however the following day he could not go to work. He did go to work yesterday. He reports a 5 PM today he started having some right-sided lower chest pain and right-sided abdominal pain that he describes as constant and dull. He states nothing he does makes it feel worse, although he states he will have brief relief with changing positions. He states that this evening he noticed when he strained there was a bulging in his upper abdomen that he has never had before. He has had nausea for the past few days and has vomited about 5 times. He has been having diarrhea about 5-6 times a day. He states he was able to eat today without making the pain worse. He reports his CBGs have been in the 200s and he was seen yesterday by his PCP who added glipizide to his diabetes regimen. He denies polyuria or nocturia.  PCP Dr Elease Hashimoto Radiation Oncology Dr Tammi Klippel Urology Dr Tresa Moore  Past Medical History  Diagnosis Date  . Hypertension   . Diabetes mellitus without complication 9/67    type 2  . Prostate cancer    Past Surgical History  Procedure Laterality Date  . Prostate biopsy    . Tonsillectomy    . Robot assisted laparoscopic radical prostatectomy N/A 08/06/2014    Procedure: ROBOTIC ASSISTED LAPAROSCOPIC RADICAL PROSTATECTOMY WITH INDOCYANINE GREEN DYE;  Surgeon: Junious Dresser, MD;  Location: [facility];  Service: Urology;   Laterality: N/A;  . Lymphadenectomy Bilateral 08/06/2014    Procedure: PELVIC LYMPH NODE DISSECTION;  Surgeon: Junious Dresser, MD;  Location: [facility];  Service: Urology;  Laterality: Bilateral;   Family History  Problem Relation Age of Onset  . Diabetes Mother     type ll  . Heart disease Mother 9    CHF  . Hypertension Father   . Hyperlipidemia Father   . Aneurysm Father     AAA  . Cancer Sister     lung  . Cancer Maternal Aunt     colon  . Cancer Maternal Uncle     prostate   History  Substance Use Topics  . Smoking status: Former Smoker -- 1.00 packs/day for 10 years    Types: Cigarettes    Quit date: 06/30/1984  . Smokeless tobacco: Current User    Types: Chew  . Alcohol Use: Yes     Comment: 6 drinks per week - beer after work    employed Lives at home Lives with spouse  Review of Systems  All other systems reviewed and are negative.     Allergies  Review of patient's allergies indicates no known allergies.  Home Medications   Prior to Admission medications   Medication Sig Start Date End Date Taking? Authorizing Provider  amLODipine (NORVASC) 10 MG tablet TAKE 1 TABLET (10 MG TOTAL) BY MOUTH DAILY. 06/14/14  Yes [name], MD  BENICAR HCT 40-25 MG per tablet TAKE 1 TABLET BY MOUTH DAILY. 07/21/14  Yes [name], MD  calcium-vitamin D (OSCAL WITH D) 500-200 MG-UNIT per tablet Take 1 tablet by mouth daily with breakfast.   Yes Historical Provider, MD  fenofibrate (TRICOR) 145 MG tablet TAKE 1 TABLET (145 MG TOTAL) BY MOUTH DAILY. 07/02/14  Yes [name], MD  glipiZIDE (GLUCOTROL) 5 MG tablet Take 0.5 tablets (2.5 mg total) by mouth 2 (two) times daily before a meal. 12/23/14  Yes Dorothyann Peng, NP  metFORMIN (GLUCOPHAGE) 500 MG tablet TAKE 2 TABLETS TWICE DAILY WITH MEALS. 08/16/14  Yes [name], MD  glucose blood (FREESTYLE LITE) test strip Use as instructed  DX: 250.00 03/01/14   [name], MD   HYDROcodone-acetaminophen (NORCO/VICODIN) 5-325 MG per tablet Take 2 tablets by mouth every 6 (six) hours as needed for moderate pain. 12/24/14   Rolland Porter, MD  Lancets (FREESTYLE) lancets Use as instructed  DX: 250.00 03/01/14   [name], MD  ondansetron (ZOFRAN ODT) 4 MG disintegrating tablet Take 1 tablet (4 mg total) by mouth every 8 (eight) hours as needed for nausea or vomiting. 12/24/14   Rolland Porter, MD   BP 132/81 mmHg  Pulse 74  Temp(Src) 98.7 F (37.1 C) (Oral)  Resp 20  SpO2 100%  Vital signs normal   Physical Exam  Constitutional: He is oriented to person, place, and time. He appears well-developed and well-nourished.  Non-toxic appearance. He does not appear ill. No distress.  HENT:  Head: Normocephalic and atraumatic.  Right Ear: External ear normal.  Left Ear: External ear normal.  Nose: Nose normal. No mucosal edema or rhinorrhea.  Mouth/Throat: Oropharynx is clear and moist and mucous membranes are normal. No dental abscesses or uvula swelling.  Eyes: Conjunctivae and EOM are normal. Pupils are equal, round, and reactive to light.  Neck: Normal range of motion and full passive range of motion without pain. Neck supple.  Cardiovascular: Normal rate, regular rhythm and normal heart sounds.  Exam reveals no gallop and no friction rub.   No murmur heard. Pulmonary/Chest: Effort normal and breath sounds normal. No respiratory distress. He has no wheezes. He has no rhonchi. He has no rales. He exhibits no tenderness and no crepitus.  Abdominal: Soft. Normal appearance and bowel sounds are normal. He exhibits no distension. There is no tenderness. There is no rebound and no guarding.  When patient strains he has a 7 cm area of bulging that is superior to the midline suprapubic incision.  Musculoskeletal: Normal range of motion. He exhibits no edema or tenderness.  Moves all extremities well.   Neurological: He is alert and oriented to person, place, and time. He has  normal strength. No cranial nerve deficit.  Skin: Skin is warm, dry and intact. No rash noted. No erythema. No pallor.  Psychiatric: He has a normal mood and affect. His speech is normal and behavior is normal. His mood appears not anxious.  Nursing note and vitals reviewed.   ED Course  Procedures (including critical care time)  Medications  0.9 %  sodium chloride infusion ( Intravenous New Bag/Given 12/24/14 0045)  sodium chloride 0.9 % bolus 500 mL (0 mLs Intravenous Stopped 12/24/14 0045)  iohexol (OMNIPAQUE) 300 MG/ML solution 50 mL (25 mLs Oral Contrast Given 12/24/14 0023)  iohexol (OMNIPAQUE) 300 MG/ML solution 100 mL (100 mLs Intravenous Contrast Given 12/24/14 0101)   Pt given IV fluids. He refused pain medications at  this time. Advised to tell his nurse if he needs pain or nausea meds.  03:00 pt rechecked, his pain is gone. We discussed his test results. He is comfortable going home and keeping his radiation therapy appt today.    Labs Review Results for orders placed or performed during the hospital encounter of 12/23/14  CBC with Differential  Result Value Ref Range   WBC 4.0 4.0 - 10.5 K/uL   RBC 3.80 (L) 4.22 - 5.81 MIL/uL   Hemoglobin 12.2 (L) 13.0 - 17.0 g/dL   HCT 33.9 (L) 39.0 - 52.0 %   MCV 89.2 78.0 - 100.0 fL   MCH 32.1 26.0 - 34.0 pg   MCHC 36.0 30.0 - 36.0 g/dL   RDW 14.1 11.5 - 15.5 %   Platelets 173 150 - 400 K/uL   Neutrophils Relative % 69 43 - 77 %   Neutro Abs 2.8 1.7 - 7.7 K/uL   Lymphocytes Relative 20 12 - 46 %   Lymphs Abs 0.8 0.7 - 4.0 K/uL   Monocytes Relative 8 3 - 12 %   Monocytes Absolute 0.3 0.1 - 1.0 K/uL   Eosinophils Relative 3 0 - 5 %   Eosinophils Absolute 0.1 0.0 - 0.7 K/uL   Basophils Relative 0 0 - 1 %   Basophils Absolute 0.0 0.0 - 0.1 K/uL  Comprehensive metabolic panel  Result Value Ref Range   Sodium 137 135 - 145 mmol/L   Potassium 3.6 3.5 - 5.1 mmol/L   Chloride 99 96 - 112 mmol/L   CO2 28 19 - 32 mmol/L   Glucose, Bld  131 (H) 70 - 99 mg/dL   BUN 21 6 - 23 mg/dL   Creatinine, Ser 0.99 0.50 - 1.35 mg/dL   Calcium 10.3 8.4 - 10.5 mg/dL   Total Protein 7.5 6.0 - 8.3 g/dL   Albumin 5.0 3.5 - 5.2 g/dL   AST 30 0 - 37 U/L   ALT 41 0 - 53 U/L   Alkaline Phosphatase 34 (L) 39 - 117 U/L   Total Bilirubin 1.3 (H) 0.3 - 1.2 mg/dL   GFR calc non Af Amer 90 (L) >90 mL/min   GFR calc Af Amer >90 >90 mL/min   Anion gap 10 5 - 15  Lipase, blood  Result Value Ref Range   Lipase 28 11 - 59 U/L  Urinalysis, Routine w reflex microscopic  Result Value Ref Range   Color, Urine YELLOW YELLOW   APPearance CLEAR CLEAR   Specific Gravity, Urine 1.028 1.005 - 1.030   pH 5.5 5.0 - 8.0   Glucose, UA NEGATIVE NEGATIVE mg/dL   Hgb urine dipstick NEGATIVE NEGATIVE   Bilirubin Urine NEGATIVE NEGATIVE   Ketones, ur NEGATIVE NEGATIVE mg/dL   Protein, ur NEGATIVE NEGATIVE mg/dL   Urobilinogen, UA 0.2 0.0 - 1.0 mg/dL   Nitrite NEGATIVE NEGATIVE   Leukocytes, UA NEGATIVE NEGATIVE  I-stat troponin, ED (only if pt is 56 y.o. or older & pain is above umbilicus) - do not order at [facility]  Result Value Ref Range   Troponin i, poc 0.00 0.00 - 0.08 ng/mL   Comment 3            Laboratory interpretation all normal except mild anemia   Imaging Review Ct Abdomen Pelvis W Contrast  12/24/2014   CLINICAL DATA:  54 year old male with 1 day history of abdominal pain. History of prostate cancer.  EXAM: CT ABDOMEN AND PELVIS WITH CONTRAST  TECHNIQUE: Multidetector CT imaging of  the abdomen and pelvis was performed using the standard protocol following bolus administration of intravenous contrast.  CONTRAST:  159mL OMNIPAQUE IOHEXOL 300 MG/ML  SOLN  COMPARISON:  Prior CT abdomen/ pelvis 05/05/2014  FINDINGS: Lower Chest: Isolated emphysematous bleb versus pulmonary cyst in the periphery of the right lower lobe. Otherwise, the lungs are clear. Visualized cardiac structures within normal limits for size. Unremarkable distal thoracic esophagus.   Abdomen: Unremarkable CT appearance of the stomach, duodenum, spleen, adrenal glands and pancreas. Normal hepatic contour and morphology. No discrete hepatic lesion. Cholelithiasis in the gallbladder neck. No evidence of gallbladder wall thickening or pericholecystic fluid to suggest cholecystitis.  Unremarkable appearance of the bilateral kidneys. No focal solid lesion, hydronephrosis or nephrolithiasis.  Colonic diverticular disease without CT evidence of active inflammation. No evidence of obstruction or focal bowel wall thickening. Normal appendix in the right lower quadrant. The terminal ileum is unremarkable. No free fluid or suspicious adenopathy.  Pelvis: Atretic prostate gland versus prior surgical resection. The bladder is under distended with diffuse mild thickening of the wall. No free fluid or suspicious adenopathy.  Bones/Soft Tissues: No acute fracture or aggressive appearing lytic or blastic osseous lesion. Stable patchy sclerosis in the left iliac bone. There was no evidence of metabolic activity on prior bone scan. Tiny nonspecific nodular soft tissue just superior to the umbilicus likely represents scar tissue or small granuloma at the site of prior healed incision.  Vascular: Atherosclerotic vascular disease without significant stenosis or aneurysmal dilatation.  IMPRESSION: 1. No acute abnormality in the abdomen or pelvis. 2. Cholelithiasis. 3. Atherosclerotic vascular calcifications.   Electronically Signed   By: Jacqulynn Cadet M.D.   On: 12/24/2014 01:34     EKG Interpretation   Date/Time:  Thursday December 23 2014 21:46:08 EDT Ventricular Rate:  86 PR Interval:  172 QRS Duration: 88 QT Interval:  382 QTC Calculation: 457 R Axis:   -22 Text Interpretation:  Sinus rhythm Borderline left axis deviation Low  voltage, precordial leads Abnormal R-wave progression, early transition  Borderline T wave abnormalities Baseline wander in lead(s) V2 No  significant change since last  tracing 02 Aug 2014 Confirmed by Brooklinn Longbottom   MD-I, Ileen Kahre ([id number]) on 12/23/2014 11:42:35 PM      MDM   Final diagnoses:  Right sided abdominal pain  Nausea vomiting and diarrhea  Ventral hernia without obstruction or gangrene  Gallstones     New Prescriptions   HYDROCODONE-ACETAMINOPHEN (NORCO/VICODIN) 5-325 MG PER TABLET    Take 2 tablets by mouth every 6 (six) hours as needed for moderate pain.   ONDANSETRON (ZOFRAN ODT) 4 MG DISINTEGRATING TABLET    Take 1 tablet (4 mg total) by mouth every 8 (eight) hours as needed for nausea or vomiting.    Plan discharge  Rolland Porter, MD, Barbette Or, MD 12/24/14 [phone]

## 2014-12-27 ENCOUNTER — Encounter: Payer: Self-pay | Admitting: Family Medicine

## 2014-12-27 ENCOUNTER — Ambulatory Visit
Admission: RE | Admit: 2014-12-27 | Discharge: 2014-12-27 | Disposition: A | Discharge status: Home / Self Care | Payer: BLUE CROSS/BLUE SHIELD | Source: Ambulatory Visit | Attending: Radiation Oncology | Admitting: Radiation Oncology

## 2014-12-27 ENCOUNTER — Ambulatory Visit (INDEPENDENT_AMBULATORY_CARE_PROVIDER_SITE_OTHER): Payer: BLUE CROSS/BLUE SHIELD | Admitting: Family Medicine

## 2014-12-27 VITALS — BP 120/70 | HR 96 | Temp 98.1°F | Wt 220.0 lb

## 2014-12-27 DIAGNOSIS — E1165 Type 2 diabetes mellitus with hyperglycemia: Secondary | ICD-10-CM

## 2014-12-27 DIAGNOSIS — R5383 Other fatigue: Secondary | ICD-10-CM | POA: Diagnosis not present

## 2014-12-27 DIAGNOSIS — IMO0002 Reserved for concepts with insufficient information to code with codable children: Secondary | ICD-10-CM

## 2014-12-27 DIAGNOSIS — R300 Dysuria: Secondary | ICD-10-CM | POA: Diagnosis not present

## 2014-12-27 DIAGNOSIS — R351 Nocturia: Secondary | ICD-10-CM | POA: Insufficient documentation

## 2014-12-27 DIAGNOSIS — C61 Malignant neoplasm of prostate: Secondary | ICD-10-CM | POA: Diagnosis not present

## 2014-12-27 DIAGNOSIS — Z510 Encounter for antineoplastic radiation therapy: Secondary | ICD-10-CM | POA: Insufficient documentation

## 2014-12-27 LAB — TSH: TSH: 0.94 u[IU]/mL (ref 0.35–4.50)

## 2014-12-27 NOTE — Progress Notes (Signed)
Pre visit review using our clinic review tool, if applicable. No additional management support is needed unless otherwise documented below in the visit note. 

## 2014-12-27 NOTE — Patient Instructions (Signed)
Take BOTH the metformin and Glipizide twice daily Continue to monitor sugars and our goal is to get fasting around 130 or less.

## 2014-12-27 NOTE — Progress Notes (Signed)
Subjective:    Patient ID: [name], male    DOB: 01-Mar-1961, 54 y.o.   MRN: [id number]  HPI   Patient is here for follow-up regarding type 2 diabetes. He had fairly good control until recently. He has had some fasting blood sugars up over 200 range. Was seen here week ago and addition of glipizide but patient had misunderstanding and stopped his metformin. He is only taking glipizide at this point. His blood sugars are not much better but no worse. He does not have any polyuria or polydipsia. He complains of some general fatigue. He has prostate cancer which is been treated with radiation and hormonal therapy with Lupron. He consulted with radiation therapy last week and they felt that his fatigue was not likely related to the radiation.  He had several recent labs including CBC and comprehensive metabolic panel which were basically unremarkable. Patient does relate that he has had several small deer ticks this past year. He has land up in [place]. He has not had any specific arthralgias, fever, or any rash suspicious for erythema migrans.  He was seen recently in ED with abdominal swelling. Apparently diagnosed with ventral hernia which has been nontender. He had CT which also showed gallstones. He has had some recent right upper quadrant abdominal pain and has consultation pending with surgeon. No consistent postprandial abdominal pains  Past Medical History  Diagnosis Date  . Hypertension   . Diabetes mellitus without complication 7/51    type 2  . Prostate cancer    Past Surgical History  Procedure Laterality Date  . Prostate biopsy    . Tonsillectomy    . Robot assisted laparoscopic radical prostatectomy N/A 08/06/2014    Procedure: ROBOTIC ASSISTED LAPAROSCOPIC RADICAL PROSTATECTOMY WITH INDOCYANINE GREEN DYE;  Surgeon: Junious Dresser, MD;  Location: [facility];  Service: Urology;  Laterality: N/A;  . Lymphadenectomy Bilateral 08/06/2014    Procedure: PELVIC LYMPH NODE  DISSECTION;  Surgeon: Junious Dresser, MD;  Location: [facility];  Service: Urology;  Laterality: Bilateral;    reports that he quit smoking about 30 years ago. His smoking use included Cigarettes. He has a 10 pack-year smoking history. His smokeless tobacco use includes Chew. He reports that he drinks alcohol. He reports that he does not use illicit drugs. family history includes Aneurysm in his father; Cancer in his maternal aunt, maternal uncle, and sister; Diabetes in his mother; Heart disease (age of onset: 50) in his mother; Hyperlipidemia in his father; Hypertension in his father. No Known Allergies    Review of Systems  Constitutional: Positive for fatigue.  Eyes: Negative for visual disturbance.  Respiratory: Negative for cough, chest tightness and shortness of breath.   Cardiovascular: Negative for chest pain, palpitations and leg swelling.  Genitourinary: Negative for dysuria.  Skin: Positive for rash.  Neurological: Negative for dizziness, syncope, weakness, light-headedness and headaches.       Objective:   Physical Exam  Constitutional: He appears well-developed and well-nourished.  Neck: Neck supple. No thyromegaly present.  Cardiovascular: Normal rate and regular rhythm.   Pulmonary/Chest: Effort normal and breath sounds normal. No respiratory distress. He has no wheezes. He has no rales.  Abdominal: Soft. Bowel sounds are normal. He exhibits no distension. There is no tenderness. There is no rebound and no guarding.  Soft ventral hernia  Musculoskeletal: He exhibits no edema.          Assessment & Plan:  #1 type 2 diabetes. Recent poor  control. We made clear to patient that he should be on metformin as foundation drug and add glipizide. He had stopped taking his metformin during the past week. If he is not seeing consistent fasting blood sugars less than 130 over the next couple weeks be in touch otherwise plan routine follow-up in 2 months #2 fatigue. Question  multifactorial. Check TSH and Lyme antibody testing

## 2014-12-28 ENCOUNTER — Ambulatory Visit
Admission: RE | Admit: 2014-12-28 | Discharge: 2014-12-28 | Disposition: A | Discharge status: Home / Self Care | Payer: BLUE CROSS/BLUE SHIELD | Source: Ambulatory Visit | Attending: Radiation Oncology | Admitting: Radiation Oncology

## 2014-12-28 ENCOUNTER — Ambulatory Visit: Payer: BLUE CROSS/BLUE SHIELD

## 2014-12-28 DIAGNOSIS — Z510 Encounter for antineoplastic radiation therapy: Secondary | ICD-10-CM | POA: Diagnosis not present

## 2014-12-28 LAB — B. BURGDORFI ANTIBODIES: B burgdorferi Ab IgG+IgM: 0.57 {ISR}

## 2014-12-29 ENCOUNTER — Ambulatory Visit
Admission: RE | Admit: 2014-12-29 | Discharge: 2014-12-29 | Disposition: A | Discharge status: Home / Self Care | Payer: BLUE CROSS/BLUE SHIELD | Source: Ambulatory Visit | Attending: Radiation Oncology | Admitting: Radiation Oncology

## 2014-12-29 ENCOUNTER — Ambulatory Visit: Payer: BLUE CROSS/BLUE SHIELD

## 2014-12-29 DIAGNOSIS — Z510 Encounter for antineoplastic radiation therapy: Secondary | ICD-10-CM | POA: Diagnosis not present

## 2014-12-30 ENCOUNTER — Ambulatory Visit
Admission: RE | Admit: 2014-12-30 | Discharge: 2014-12-30 | Disposition: A | Discharge status: Home / Self Care | Payer: BLUE CROSS/BLUE SHIELD | Source: Ambulatory Visit | Attending: Radiation Oncology | Admitting: Radiation Oncology

## 2014-12-30 DIAGNOSIS — Z510 Encounter for antineoplastic radiation therapy: Secondary | ICD-10-CM | POA: Diagnosis not present

## 2014-12-31 ENCOUNTER — Ambulatory Visit
Admission: RE | Admit: 2014-12-31 | Discharge: 2014-12-31 | Disposition: A | Discharge status: Home / Self Care | Payer: BLUE CROSS/BLUE SHIELD | Source: Ambulatory Visit | Attending: Radiation Oncology | Admitting: Radiation Oncology

## 2014-12-31 VITALS — BP 111/53 | HR 80 | Temp 98.2°F | Wt 218.0 lb

## 2014-12-31 DIAGNOSIS — Z510 Encounter for antineoplastic radiation therapy: Secondary | ICD-10-CM | POA: Diagnosis not present

## 2014-12-31 DIAGNOSIS — C61 Malignant neoplasm of prostate: Secondary | ICD-10-CM

## 2014-12-31 NOTE — Progress Notes (Signed)
Weekly assessment of radiation to pelvis.Completed 28 of 40 treatments.Denies pain.No urinary or bowel problems.States main side effect if fatigue.

## 2014-12-31 NOTE — Progress Notes (Signed)
  Radiation Oncology         [phone] ________________________________  Name: [name] MRN: [id number]  Date: 12/31/2014  DOB: 03-May-1961     Weekly Radiation Therapy Management    ICD-9-CM ICD-10-CM   1. Prostate cancer 185 C61     Current Dose: 50.4 Gy     Planned Dose:  68.4 Gy  Narrative . . . . . . . . The patient presents for routine under treatment assessment.                                   Weekly assessment of radiation to pelvis.Completed 28 of 40 treatments.Denies pain.No urinary or bowel problems.States main side effect if fatigue                                 Set-up films were reviewed.                                 The chart was checked.  Physical Findings. . .  weight is 218 lb (98.884 kg). His temperature is 98.2 F (36.8 C). His blood pressure is 111/53 and his pulse is 80. His oxygen saturation is 99%. . Weight essentially stable.  No significant changes.  Impression . . . . . . . The patient is tolerating radiation.  Plan . . . . . . . . . . . . Continue treatment as planned.  This document serves as a record of services personally performed by Tyler Pita, MD. It was created on his behalf by Jeralene Peters, a trained medical scribe. The creation of this record is based on the scribe's personal observations and the provider's statements to them. This document has been checked and approved by the attending provider.       ________________________________  Sheral Apley. Tammi Klippel, M.D.

## 2015-01-03 ENCOUNTER — Ambulatory Visit
Admission: RE | Admit: 2015-01-03 | Discharge: 2015-01-03 | Disposition: A | Discharge status: Home / Self Care | Payer: BLUE CROSS/BLUE SHIELD | Source: Ambulatory Visit | Attending: Radiation Oncology | Admitting: Radiation Oncology

## 2015-01-03 DIAGNOSIS — Z510 Encounter for antineoplastic radiation therapy: Secondary | ICD-10-CM | POA: Diagnosis not present

## 2015-01-04 ENCOUNTER — Ambulatory Visit
Admission: RE | Admit: 2015-01-04 | Discharge: 2015-01-04 | Disposition: A | Discharge status: Home / Self Care | Payer: BLUE CROSS/BLUE SHIELD | Source: Ambulatory Visit | Attending: Radiation Oncology | Admitting: Radiation Oncology

## 2015-01-04 DIAGNOSIS — Z510 Encounter for antineoplastic radiation therapy: Secondary | ICD-10-CM | POA: Diagnosis not present

## 2015-01-05 ENCOUNTER — Ambulatory Visit
Admission: RE | Admit: 2015-01-05 | Discharge: 2015-01-05 | Disposition: A | Discharge status: Home / Self Care | Payer: BLUE CROSS/BLUE SHIELD | Source: Ambulatory Visit | Attending: Radiation Oncology | Admitting: Radiation Oncology

## 2015-01-05 DIAGNOSIS — Z510 Encounter for antineoplastic radiation therapy: Secondary | ICD-10-CM | POA: Diagnosis not present

## 2015-01-06 ENCOUNTER — Ambulatory Visit
Admission: RE | Admit: 2015-01-06 | Discharge: 2015-01-06 | Disposition: A | Discharge status: Home / Self Care | Payer: BLUE CROSS/BLUE SHIELD | Source: Ambulatory Visit | Attending: Radiation Oncology | Admitting: Radiation Oncology

## 2015-01-06 DIAGNOSIS — Z510 Encounter for antineoplastic radiation therapy: Secondary | ICD-10-CM | POA: Diagnosis not present

## 2015-01-07 ENCOUNTER — Ambulatory Visit
Admission: RE | Admit: 2015-01-07 | Discharge: 2015-01-07 | Disposition: A | Discharge status: Home / Self Care | Payer: BLUE CROSS/BLUE SHIELD | Source: Ambulatory Visit | Attending: Radiation Oncology | Admitting: Radiation Oncology

## 2015-01-07 ENCOUNTER — Encounter: Payer: Self-pay | Admitting: Radiation Oncology

## 2015-01-07 VITALS — BP 96/58 | HR 90 | Resp 16 | Wt 220.3 lb

## 2015-01-07 DIAGNOSIS — C61 Malignant neoplasm of prostate: Secondary | ICD-10-CM

## 2015-01-07 DIAGNOSIS — Z510 Encounter for antineoplastic radiation therapy: Secondary | ICD-10-CM | POA: Diagnosis not present

## 2015-01-07 NOTE — Progress Notes (Signed)
Weight stable. Biggest complaint is weakness, lethargy and fatigue. BP low. Denies feeling dizzy or lightheaded. Denies diarrhea. Reports nocturia x2.

## 2015-01-07 NOTE — Progress Notes (Signed)
   Department of Radiation Oncology  Phone:  [phone] Fax:        [phone]  Weekly Treatment Note    Name: [name] Date: 01/07/2015 MRN: [id number] DOB: 03-28-1961   Current dose: 59.4 Gy  Current fraction: 33   MEDICATIONS: Current Outpatient Prescriptions  Medication Sig Dispense Refill  . amLODipine (NORVASC) 10 MG tablet TAKE 1 TABLET (10 MG TOTAL) BY MOUTH DAILY. 90 tablet 2  . BENICAR HCT 40-25 MG per tablet TAKE 1 TABLET BY MOUTH DAILY. 30 tablet 5  . calcium-vitamin D (OSCAL WITH D) 500-200 MG-UNIT per tablet Take 1 tablet by mouth daily with breakfast.    . fenofibrate (TRICOR) 145 MG tablet TAKE 1 TABLET (145 MG TOTAL) BY MOUTH DAILY. 30 tablet 5  . glipiZIDE (GLUCOTROL) 5 MG tablet Take 0.5 tablets (2.5 mg total) by mouth 2 (two) times daily before a meal. 60 tablet 1  . glucose blood (FREESTYLE LITE) test strip Use as instructed  DX: 250.00 100 each 3  . HYDROcodone-acetaminophen (NORCO/VICODIN) 5-325 MG per tablet Take 2 tablets by mouth every 6 (six) hours as needed for moderate pain. 10 tablet 0  . Lancets (FREESTYLE) lancets Use as instructed  DX: 250.00 100 each 3  . metFORMIN (GLUCOPHAGE) 500 MG tablet TAKE 2 TABLETS TWICE DAILY WITH MEALS. 120 tablet 2  . ondansetron (ZOFRAN ODT) 4 MG disintegrating tablet Take 1 tablet (4 mg total) by mouth every 8 (eight) hours as needed for nausea or vomiting. (Patient not taking: Reported on 01/07/2015) 10 tablet 0   No current facility-administered medications for this encounter.     ALLERGIES: Review of patient's allergies indicates no known allergies.   LABORATORY DATA:  Lab Results  Component Value Date   WBC 4.0 12/23/2014   HGB 12.2* 12/23/2014   HCT 33.9* 12/23/2014   MCV 89.2 12/23/2014   PLT 173 12/23/2014   Lab Results  Component Value Date   NA 137 12/23/2014   K 3.6 12/23/2014   CL 99 12/23/2014   CO2 28 12/23/2014   Lab Results  Component Value Date   ALT 41 12/23/2014   AST 30  12/23/2014   ALKPHOS 34* 12/23/2014   BILITOT 1.3* 12/23/2014     NARRATIVE: [name] was seen today for weekly treatment management. The chart was checked and the patient's films were reviewed.  Weight stable. Biggest complaint is weakness, lethargy and fatigue. BP low. Denies feeling dizzy or lightheaded. Denies diarrhea. Reports nocturia x2.   PHYSICAL EXAMINATION: weight is 220 lb 4.8 oz (99.927 kg). His blood pressure is 96/58 and his pulse is 90. His respiration is 16.        ASSESSMENT: The patient is doing satisfactorily with treatment.  PLAN: We will continue with the patient's radiation treatment as planned.       This document serves as a record of services personally performed by Kyung Rudd, MD. It was created on his behalf by Derek Mound, a trained medical scribe. The creation of this record is based on the scribe's personal observations and the provider's statements to them. This document has been checked and approved by the attending provider.

## 2015-01-10 ENCOUNTER — Ambulatory Visit
Admission: RE | Admit: 2015-01-10 | Discharge: 2015-01-10 | Disposition: A | Discharge status: Home / Self Care | Payer: BLUE CROSS/BLUE SHIELD | Source: Ambulatory Visit | Attending: Radiation Oncology | Admitting: Radiation Oncology

## 2015-01-10 ENCOUNTER — Ambulatory Visit: Payer: Self-pay | Admitting: Surgery

## 2015-01-10 DIAGNOSIS — Z510 Encounter for antineoplastic radiation therapy: Secondary | ICD-10-CM | POA: Diagnosis not present

## 2015-01-10 NOTE — H&P (Signed)
History of Present Illness [name]. Derek Corporan MD; 01/10/2015 11:43 AM) Patient words: hernia and GB.  The patient is a 54 year old male who presents for evaluation of gall stones. Referred by Dr. Carolann Littler for evaluation of symptomatic gallstones.   This is a 54 year old male who is undergoing treatment for prostate cancer. He had a robotic prostatectomy in December 2015. He is undergoing radiation treatments and is due to finish those treatments this week. He has a lot of weakness associated with the radiation treatments but is otherwise doing well. He went to the emergency department 2 weeks ago with several days of severe right upper quadrant and right lower chest pain that radiated around his right side. He had a lot of abdominal bloating with these attacks of pain. He has diarrhea associated with his radiation treatments. Ultrasound showed gallstones but no sign of cholecystitis. He presents now to discuss elective surgery.    CLINICAL DATA: 54 year old male with 1 day history of abdominal pain. History of prostate cancer.  EXAM: CT ABDOMEN AND PELVIS WITH CONTRAST  TECHNIQUE: Multidetector CT imaging of the abdomen and pelvis was performed using the standard protocol following bolus administration of intravenous contrast.  CONTRAST: 157m OMNIPAQUE IOHEXOL 300 MG/ML SOLN  COMPARISON: Prior CT abdomen/ pelvis 05/05/2014  FINDINGS: Lower Chest: Isolated emphysematous bleb versus pulmonary cyst in the periphery of the right lower lobe. Otherwise, the lungs are clear. Visualized cardiac structures within normal limits for size. Unremarkable distal thoracic esophagus.  Abdomen: Unremarkable CT appearance of the stomach, duodenum, spleen, adrenal glands and pancreas. Normal hepatic contour and morphology. No discrete hepatic lesion. Cholelithiasis in the gallbladder neck. No evidence of gallbladder wall thickening or pericholecystic fluid to suggest  cholecystitis.  Unremarkable appearance of the bilateral kidneys. No focal solid lesion, hydronephrosis or nephrolithiasis.  Colonic diverticular disease without CT evidence of active inflammation. No evidence of obstruction or focal bowel wall thickening. Normal appendix in the right lower quadrant. The terminal ileum is unremarkable. No free fluid or suspicious adenopathy.  Pelvis: Atretic prostate gland versus prior surgical resection. The bladder is under distended with diffuse mild thickening of the wall. No free fluid or suspicious adenopathy.  Bones/Soft Tissues: No acute fracture or aggressive appearing lytic or blastic osseous lesion. Stable patchy sclerosis in the left iliac bone. There was no evidence of metabolic activity on prior bone scan. Tiny nonspecific nodular soft tissue just superior to the umbilicus likely represents scar tissue or small granuloma at the site of prior healed incision.  Vascular: Atherosclerotic vascular disease without significant stenosis or aneurysmal dilatation.  IMPRESSION: 1. No acute abnormality in the abdomen or pelvis. 2. Cholelithiasis. 3. Atherosclerotic vascular calcifications.   Electronically Signed By: HJacqulynn CadetM.D. On: 12/24/2014 01:34   WBC 4.0 Hgb 12.2 T bili 1.3 Alk phos 34 AST/ALT WNL Lipase 28  Other Problems (Ammie Eversole, LPN; 54/09/811991:47AM) Cholelithiasis Diabetes Mellitus High blood pressure Prostate Cancer  Diagnostic Studies History (Ammie Eversole, LPN; 58/29/562193:08AM) Colonoscopy 5-10 years ago  Allergies (Ammie Eversole, LPN; 56/57/846996:29AM) No Known Drug Allergies 01/10/2015  Medication History (Ammie Eversole, LPN; 55/28/413294:40AM) Norvasc (10MG Tablet, Oral) Active. Benicar HCT (40-25MG Tablet, Oral) Active. Oscal 500/200 D-3 (500-200MG-UNIT Tablet, Oral) Active. Tricor (145MG Tablet, Oral) Active. Glucotrol XL (5MG Tablet ER 24HR, Oral) Active. Norco  (5-325MG Tablet, Oral) Active. Glucophage XR (500MG Tablet ER 24HR, Oral) Active. Zofran ODT (4MG Tablet Disperse, Oral) Active.  Social History (Aleatha Borer LPN; 51/02/725396:64AM)  Alcohol use Occasional alcohol use. Caffeine use Coffee. No drug use Tobacco use Former smoker.  Family History Aleatha Borer, LPN; 04/05/1750 0:25 AM) Alcohol Abuse Father. Arthritis Father, Mother. Diabetes Mellitus Mother. Heart Disease Mother. Hypertension Father. Respiratory Condition Father, Mother. Thyroid problems Mother.     Review of Systems (Ammie Eversole LPN; 8/52/7782 4:23 AM) General Not Present- Appetite Loss, Chills, Fatigue, Fever, Night Sweats, Weight Gain and Weight Loss. Skin Not Present- Change in Wart/Mole, Dryness, Hives, Jaundice, New Lesions, Non-Healing Wounds, Rash and Ulcer. HEENT Not Present- Earache, Hearing Loss, Hoarseness, Nose Bleed, Oral Ulcers, Ringing in the Ears, Seasonal Allergies, Sinus Pain, Sore Throat, Visual Disturbances, Wears glasses/contact lenses and Yellow Eyes. Respiratory Not Present- Bloody sputum, Chronic Cough, Difficulty Breathing, Snoring and Wheezing. Breast Not Present- Breast Mass, Breast Pain, Nipple Discharge and Skin Changes. Cardiovascular Not Present- Chest Pain, Difficulty Breathing Lying Down, Leg Cramps, Palpitations, Rapid Heart Rate, Shortness of Breath and Swelling of Extremities. Gastrointestinal Not Present- Abdominal Pain, Bloating, Bloody Stool, Change in Bowel Habits, Chronic diarrhea, Constipation, Difficulty Swallowing, Excessive gas, Gets full quickly at meals, Hemorrhoids, Indigestion, Nausea, Rectal Pain and Vomiting. Male Genitourinary Present- Change in Urinary Stream, Frequency and Urine Leakage. Not Present- Blood in Urine, Impotence, Nocturia, Painful Urination and Urgency. Musculoskeletal Not Present- Back Pain, Joint Pain, Joint Stiffness, Muscle Pain, Muscle Weakness and Swelling of  Extremities. Neurological Not Present- Decreased Memory, Fainting, Headaches, Numbness, Seizures, Tingling, Tremor, Trouble walking and Weakness. Psychiatric Not Present- Anxiety, Bipolar, Change in Sleep Pattern, Depression, Fearful and Frequent crying. Endocrine Not Present- Cold Intolerance, Excessive Hunger, Hair Changes, Heat Intolerance, Hot flashes and New Diabetes. Hematology Not Present- Easy Bruising, Excessive bleeding, Gland problems, HIV and Persistent Infections.  Vitals (Ammie Eversole LPN; 5/36/1443 1:54 AM) 01/10/2015 9:29 AM Weight: 217.8 lb Height: 69in Body Surface Area: 2.19 m Body Mass Index: 32.16 kg/m Temp.: 97.77F(Oral)  Pulse: 70 (Regular)  BP: 110/62 (Sitting, Left Arm, Standard)     Physical Exam Rodman Key K. Recie Cirrincione MD; 01/10/2015 11:43 AM)  The physical exam findings are as follows: Note:WDWN in NAD HEENT: EOMI, sclera anicteric Neck: No masses, no thyromegaly Lungs: CTA bilaterally; normal respiratory effort CV: Regular rate and rhythm; no murmurs Abd: +bowel sounds, soft, healed laparoscopic incisions; mild RUQ tenderness  Ext: Well-perfused; no edema Skin: Warm, dry; no sign of jaundice    Assessment & Plan Rodman Key K. Melessa Cowell MD; 01/10/2015 11:43 AM)  Current Plans Pt Education - Gallstones *: gallbladder disease Schedule for Surgery - Laparoscopic cholecystectomy with intraoperative cholangiogram. The surgical procedure has been discussed with the patient. Potential risks, benefits, alternative treatments, and expected outcomes have been explained. All of the patient's questions at this time have been answered. The likelihood of reaching the patient's treatment goal is good. The patient understand the proposed surgical procedure and wishes to proceed.  We will schedule the surgery in a few weeks to give the patient time to regain his strength after his radiation treatments. CHRONIC CHOLECYSTITIS WITH CALCULUS (574.10   K80.10)   [name]. Georgette Dover, MD, [facility] Surgery  General/ Trauma Surgery  01/10/2015 11:44 AM

## 2015-01-11 ENCOUNTER — Ambulatory Visit
Admission: RE | Admit: 2015-01-11 | Discharge: 2015-01-11 | Disposition: A | Discharge status: Home / Self Care | Payer: BLUE CROSS/BLUE SHIELD | Source: Ambulatory Visit | Attending: Radiation Oncology | Admitting: Radiation Oncology

## 2015-01-11 DIAGNOSIS — Z510 Encounter for antineoplastic radiation therapy: Secondary | ICD-10-CM | POA: Diagnosis not present

## 2015-01-12 ENCOUNTER — Ambulatory Visit
Admission: RE | Admit: 2015-01-12 | Discharge: 2015-01-12 | Disposition: A | Discharge status: Home / Self Care | Payer: BLUE CROSS/BLUE SHIELD | Source: Ambulatory Visit | Attending: Radiation Oncology | Admitting: Radiation Oncology

## 2015-01-12 DIAGNOSIS — Z510 Encounter for antineoplastic radiation therapy: Secondary | ICD-10-CM | POA: Diagnosis not present

## 2015-01-13 ENCOUNTER — Ambulatory Visit
Admission: RE | Admit: 2015-01-13 | Discharge: 2015-01-13 | Disposition: A | Discharge status: Home / Self Care | Payer: BLUE CROSS/BLUE SHIELD | Source: Ambulatory Visit | Attending: Radiation Oncology | Admitting: Radiation Oncology

## 2015-01-13 ENCOUNTER — Ambulatory Visit: Payer: BLUE CROSS/BLUE SHIELD

## 2015-01-13 ENCOUNTER — Encounter: Payer: Self-pay | Admitting: Radiation Oncology

## 2015-01-13 VITALS — BP 128/86 | HR 79 | Resp 16 | Wt 223.2 lb

## 2015-01-13 DIAGNOSIS — Z510 Encounter for antineoplastic radiation therapy: Secondary | ICD-10-CM | POA: Diagnosis not present

## 2015-01-13 DIAGNOSIS — C61 Malignant neoplasm of prostate: Secondary | ICD-10-CM

## 2015-01-13 NOTE — Progress Notes (Signed)
  Radiation Oncology         [phone] ________________________________  Name: [name] MRN: [id number]  Date: 01/13/2015  DOB: 20-Feb-1961    Weekly Radiation Therapy Management    ICD-9-CM ICD-10-CM   1. Prostate cancer 185 C61     Current Dose: 66.6 Gy     Planned Dose:  68.4 Gy  Narrative . . . . . . . . The patient presents for routine under treatment assessment.                                   Reports dysuria. Denies hematuria. Reports infrequent stress incontinence. Reports nocturia x 2. Describes a strong steady urine stream. Biggest complaint is fatigue and weakness. Scheduled to follow up with his urologist in June. Seen at [place]. He has been told he doesn't have a hernia but, does need his gallbladder out. Discussed when would be okay for him to have his gallbladder removed, dependent on gall stone issues. He currently sees Dr. Georgette Dover about his gallbladder.                                 Set-up films were reviewed.                                 The chart was checked. Physical Findings. . .  weight is 223 lb 3.2 oz (101.243 kg). His blood pressure is 128/86 and his pulse is 79. His respiration is 16. . Weight essentially stable.  No significant changes. Impression . . . . . . . The patient is tolerating radiation. Plan . . . . . . . . . . . . Continue treatment as planned. I will correspond with Dr. Georgette Dover regarding cholecystectomy timing. One month follow up appointment card given since patient completes tomorrow.   This document serves as a record of services personally performed by Tyler Pita, MD. It was created on his behalf by Arlyce Harman, a trained medical scribe. The creation of this record is based on the scribe's personal observations and the provider's statements to them. This document has been checked and approved by the attending provider.     ________________________________  Sheral Apley. Tammi Klippel, M.D.

## 2015-01-13 NOTE — Progress Notes (Signed)
Weight and vitals stable. Reports dysuria. Denies hematuria. Reports infrequent stress incontinence. Reports nocturia x 2. Describes a strong steady urine stream. Biggest complaint is fatigue and weakness. Scheduled to follow up with his urologist in June. Seen at [place]. He has been told he doesn't have a hernia but, does need his gallbladder out ASAP with Dr. Johny Shears approval. One month follow up appointment card given since patient completes tomorrow.

## 2015-01-14 ENCOUNTER — Ambulatory Visit
Admission: RE | Admit: 2015-01-14 | Discharge: 2015-01-14 | Disposition: A | Discharge status: Home / Self Care | Payer: BLUE CROSS/BLUE SHIELD | Source: Ambulatory Visit | Attending: Radiation Oncology | Admitting: Radiation Oncology

## 2015-01-14 ENCOUNTER — Ambulatory Visit: Payer: BLUE CROSS/BLUE SHIELD

## 2015-01-14 ENCOUNTER — Other Ambulatory Visit: Payer: Self-pay | Admitting: Family Medicine

## 2015-01-14 ENCOUNTER — Encounter: Payer: Self-pay | Admitting: Radiation Oncology

## 2015-01-14 DIAGNOSIS — Z510 Encounter for antineoplastic radiation therapy: Secondary | ICD-10-CM | POA: Diagnosis not present

## 2015-01-16 NOTE — Progress Notes (Signed)
  Radiation Oncology         [phone] ________________________________    Name: [name] MRN: [id number]  Date: 01/14/2015  DOB: 12/01/1960  End of Treatment Note   ICD-9-CM ICD-10-CM    1. 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 The prostatic fossa and pelvic lymph nodes were initially treated to 45 Gy in 25 fractions of 1.8 Gy  2. The prostatic fossa only was boosted to 68.4 Gy in 13 additional fractions of 1.8 Gy  Beams/energy:  1. The prostatic fossa and pelvic lymph nodes were initially treated using helical intensity modulated radiotherapy delivering 6 megavolt photons. Image guidance was performed with megavoltage CT studies prior to each fraction. He was immobilized with a body fix lower extremity mold.  2. the prostatic fossa only was boosted using helical intensity modulated radiotherapy delivering 6 megavolt photons. Image guidance was performed with megavoltage CT studies prior to each fraction. He was immobilized with a body fix lower extremity mold.  Narrative: The patient tolerated radiation treatment relatively well.   He experienced anticipated dysuria. He any hematuria. He experienced occasional episodes of infrequent stress incontinence. He reported nocturia twice nightly. His biggest complaint during radiation was fatigue and weakness. The patient was scheduled for a follow-up with his urologist in June. He has also seen [facility] and requires gallbladder surgery in the near future.  Plan: The patient has completed radiation treatment. He will return to radiation oncology clinic for  routine followup in one month. I advised him to call or return sooner if he has any questions or concerns related to his recovery or treatment. ________________________________  Sheral Apley. Tammi Klippel, M.D.

## 2015-01-17 ENCOUNTER — Ambulatory Visit: Payer: BLUE CROSS/BLUE SHIELD

## 2015-01-20 ENCOUNTER — Ambulatory Visit: Payer: BLUE CROSS/BLUE SHIELD

## 2015-01-21 ENCOUNTER — Telehealth: Payer: Self-pay

## 2015-01-21 NOTE — Telephone Encounter (Signed)
Pt informed. Pt is also informed that in the future patient needs to set up mychart and communicate to [place] through mychart. Pt sent a message on the [employer] website.  Pt verbally understood.

## 2015-01-21 NOTE — Telephone Encounter (Signed)
They were negative

## 2015-01-21 NOTE — Telephone Encounter (Signed)
Pt is asking about Lyme disease results. It only says in chart Lyme antibodies.

## 2015-01-21 NOTE — Telephone Encounter (Signed)
Left message for patient to return call.

## 2015-01-28 ENCOUNTER — Other Ambulatory Visit: Payer: Self-pay | Admitting: Family Medicine

## 2015-02-16 ENCOUNTER — Other Ambulatory Visit: Payer: Self-pay | Admitting: Family Medicine

## 2015-02-17 ENCOUNTER — Encounter: Payer: Self-pay | Admitting: Radiation Oncology

## 2015-02-17 ENCOUNTER — Ambulatory Visit
Admission: RE | Admit: 2015-02-17 | Discharge: 2015-02-17 | Disposition: A | Discharge status: Home / Self Care | Payer: BLUE CROSS/BLUE SHIELD | Source: Ambulatory Visit | Attending: Radiation Oncology | Admitting: Radiation Oncology

## 2015-02-17 VITALS — BP 132/87 | HR 81 | Resp 16 | Wt 226.8 lb

## 2015-02-17 DIAGNOSIS — C61 Malignant neoplasm of prostate: Secondary | ICD-10-CM

## 2015-02-17 NOTE — Progress Notes (Signed)
Radiation Oncology         [phone] ________________________________  Name: [name] MRN: [id number]  Date: 02/17/2015  DOB: [birth year]  Follow-Up Visit Note  CC: [name], MD  Alexis Frock, MD  Diagnosis: 54 yo man s/p radical prostatectomy for Gleason's 4+4 adenocarcinoma with one positive node, multiple focally positive margins, positive seminal vesicles and extraprostatic extension with a post-op PSA of 2.74    ICD-9-CM ICD-10-CM   1. Prostate cancer 185 C61     Interval Since Last Radiation: 1 month. 01/14/2015  Site/dose:    1. The prostatic fossa and pelvic lymph nodes were initially treated to 45 Gy in 25 fractions of 1.8 Gy    2. The prostatic fossa only was boosted to 68.4 Gy in 13 additional fractions of 1.8 Gy  Narrative:  The patient returns today for routine follow-up. Weight and vitals stable. Denies pain. Seen by urologist office this morning. PSA now 0.05. His next appointment with his urologist in four months. The pt reports marked improvement in urinary symptoms over the last week. Reports diarrhea, urgency and dysuria have greatly improved. Reports nocturia x2. Denies hematuria. Denies difficulty sleeping, but reports that his fatigue continues. The pt states he had pain in the treatment area last week. He felt pain in that region this morning when Dr. Tresa Moore palpated the area, but he doesn't feel pain anymore at the moment.  ALLERGIES:  has No Known Allergies.  Meds: Current Outpatient Prescriptions  Medication Sig Dispense Refill  . BENICAR HCT 40-25 MG per tablet TAKE 1 TABLET BY MOUTH DAILY. 30 tablet 5  . calcium-vitamin D (OSCAL WITH D) 500-200 MG-UNIT per tablet Take 1 tablet by mouth daily with breakfast.    . fenofibrate (TRICOR) 145 MG tablet TAKE 1 TABLET (145 MG TOTAL) BY MOUTH DAILY. 30 tablet 5  . glipiZIDE (GLUCOTROL) 5 MG tablet Take 0.5 tablets (2.5 mg total) by mouth 2 (two) times daily before a meal. 60 tablet 1  . glucose  blood (FREESTYLE LITE) test strip Use as instructed  DX: 250.00 100 each 3  . Lancets (FREESTYLE) lancets Use as instructed  DX: 250.00 100 each 3  . metFORMIN (GLUCOPHAGE) 500 MG tablet TAKE 2 TABLETS TWICE DAILY WITH MEALS. 120 tablet 1  . amLODipine (NORVASC) 10 MG tablet TAKE 1 TABLET (10 MG TOTAL) BY MOUTH DAILY. (Patient not taking: Reported on 01/13/2015) 90 tablet 2  . HYDROcodone-acetaminophen (NORCO/VICODIN) 5-325 MG per tablet Take 2 tablets by mouth every 6 (six) hours as needed for moderate pain. (Patient not taking: Reported on 01/13/2015) 10 tablet 0  . ondansetron (ZOFRAN ODT) 4 MG disintegrating tablet Take 1 tablet (4 mg total) by mouth every 8 (eight) hours as needed for nausea or vomiting. (Patient not taking: Reported on 01/07/2015) 10 tablet 0   No current facility-administered medications for this encounter.    Physical Findings: The patient is in no acute distress. Patient is alert and oriented.  weight is 226 lb 12.8 oz (102.876 kg). His blood pressure is 132/87 and his pulse is 81. His respiration is 16. .  No significant changes.  Lab Findings: Lab Results  Component Value Date   WBC 4.0 12/23/2014   HGB 12.2* 12/23/2014   HCT 33.9* 12/23/2014   PLT 173 12/23/2014    Lab Results  Component Value Date   NA 137 12/23/2014   K 3.6 12/23/2014   CO2 28 12/23/2014   GLUCOSE 131* 12/23/2014   BUN 21 12/23/2014  CREATININE 0.99 12/23/2014   BILITOT 1.3* 12/23/2014   ALKPHOS 34* 12/23/2014   AST 30 12/23/2014   ALT 41 12/23/2014   PROT 7.5 12/23/2014   ALBUMIN 5.0 12/23/2014   CALCIUM 10.3 12/23/2014   ANIONGAP 10 12/23/2014   Impression:  The patient is recovering from the effects of radiation.  Plan:  He will continue to follow-up with urology for ongoing PSA determinations.  I will look forward to following his response through their correspondence, and be happy to participate in care if clinically indicated.  I talked to the patient about what to expect  in the future, including his risk for erectile dysfunction and rectal bleeding.  I encouraged him to call or return to the office if he has any question about his previous radiation or possible radiation effects.  He was comfortable with this plan.  This document serves as a record of services personally performed by Tyler Pita, MD. It was created on his behalf by Darcus Austin, a trained medical scribe. The creation of this record is based on the scribe's personal observations and the provider's statements to them. This document has been checked and approved by the attending provider.     _____________________________________  Sheral Apley. Tammi Klippel, M.D.

## 2015-02-17 NOTE — Progress Notes (Signed)
Weight and vitals stable. Denies pain. Seen by urologist office this morning. PSA now .05. Next appt with urologist in four month. Reports marked improvement in urinary symptoms over the last week. Reports diarrhea, urgency and dysuria have greatly improved. Reports nocturia x2. Denies hematuria. Denies difficulty sleeping but, reports fatigue continues.   BP 132/87 mmHg  Pulse 81  Resp 16  Wt 226 lb 12.8 oz (102.876 kg) Wt Readings from Last 3 Encounters:  02/17/15 226 lb 12.8 oz (102.876 kg)  01/13/15 223 lb 3.2 oz (101.243 kg)  01/07/15 220 lb 4.8 oz (99.927 kg)

## 2015-03-16 ENCOUNTER — Ambulatory Visit: Payer: BLUE CROSS/BLUE SHIELD | Admitting: Family Medicine

## 2015-03-17 ENCOUNTER — Ambulatory Visit (INDEPENDENT_AMBULATORY_CARE_PROVIDER_SITE_OTHER): Payer: BLUE CROSS/BLUE SHIELD | Admitting: Family Medicine

## 2015-03-17 ENCOUNTER — Encounter: Payer: Self-pay | Admitting: Family Medicine

## 2015-03-17 VITALS — BP 124/70 | HR 84 | Temp 98.0°F | Wt 221.0 lb

## 2015-03-17 DIAGNOSIS — K602 Anal fissure, unspecified: Secondary | ICD-10-CM | POA: Diagnosis not present

## 2015-03-17 NOTE — Progress Notes (Signed)
Pre visit review using our clinic review tool, if applicable. No additional management support is needed unless otherwise documented below in the visit note. 

## 2015-03-17 NOTE — Patient Instructions (Signed)
Anal Fissure, Adult An anal fissure is a small tear or crack in the skin around the anus. Bleeding from a fissure usually stops on its own within a few minutes. However, bleeding will often reoccur with each bowel movement until the crack heals.  CAUSES   Passing large, hard stools.  Frequent diarrheal stools.  Constipation.  Inflammatory bowel disease (Crohn's disease or ulcerative colitis).  Infections.  Anal sex. SYMPTOMS   Small amounts of blood seen on your stools, on toilet paper, or in the toilet after a bowel movement.  Rectal bleeding.  Painful bowel movements.  Itching or irritation around the anus. DIAGNOSIS Your caregiver will examine the anal area. An anal fissure can usually be seen with careful inspection. A rectal exam may be performed and a short tube (anoscope) may be used to examine the anal canal. TREATMENT   You may be instructed to take fiber supplements. These supplements can soften your stool to help make bowel movements easier.  Sitz baths may be recommended to help heal the tear. Do not use soap in the sitz baths.  A medicated cream or ointment may be prescribed to lessen discomfort. HOME CARE INSTRUCTIONS   Maintain a diet high in fruits, whole grains, and vegetables. Avoid constipating foods like bananas and dairy products.  Take sitz baths as directed by your caregiver.  Drink enough fluids to keep your urine clear or pale yellow.  Only take over-the-counter or prescription medicines for pain, discomfort, or fever as directed by your caregiver. Do not take aspirin as this may increase bleeding.  Do not use ointments containing numbing medications (anesthetics) or hydrocortisone. They could slow healing. SEEK MEDICAL CARE IF:   Your fissure is not completely healed within 3 days.  You have further bleeding.  You have a fever.  You have diarrhea mixed with blood.  You have pain.  Your problem is getting worse rather than  better. MAKE SURE YOU:   Understand these instructions.  Will watch your condition.  Will get help right away if you are not doing well or get worse. Document Released: 08/13/2005 Document Revised: 11/05/2011 Document Reviewed: 01/28/2011 [facility] Patient Information 2015 [place], [place]. This information is not intended to replace advice given to you by your health care provider. Make sure you discuss any questions you have with your health care provider.  Lots of fluids, fiber, and stool softeners daily for next few weeks.  Metamucil, fibercon May continue with Miralax as needed Sitz baths once daily.

## 2015-03-17 NOTE — Progress Notes (Signed)
   Subjective:    Patient ID: [name], male    DOB: 10-23-1960, 54 y.o.   MRN: [id number]  HPI  Patient had laparoscopic cholecystectomy last Tuesday. He was placed on pain medications afterwards. He had constipation and no bowel movement until Friday. When he went he had a very large caliber stool and since then is noticed some perianal pain. No bleeding. Has had a couple of bowel movements since then. He is taken occasional stool softener but also MiraLAX. He denies any abdominal pains whatsoever. No fevers or chills.  Past Medical History  Diagnosis Date  . Hypertension   . Diabetes mellitus without complication 4/09    type 2  . Prostate cancer    Past Surgical History  Procedure Laterality Date  . Prostate biopsy    . Tonsillectomy    . Robot assisted laparoscopic radical prostatectomy N/A 08/06/2014    Procedure: ROBOTIC ASSISTED LAPAROSCOPIC RADICAL PROSTATECTOMY WITH INDOCYANINE GREEN DYE;  Surgeon: Junious Dresser, MD;  Location: [facility];  Service: Urology;  Laterality: N/A;  . Lymphadenectomy Bilateral 08/06/2014    Procedure: PELVIC LYMPH NODE DISSECTION;  Surgeon: Junious Dresser, MD;  Location: [facility];  Service: Urology;  Laterality: Bilateral;    reports that he quit smoking about 30 years ago. His smoking use included Cigarettes. He has a 10 pack-year smoking history. His smokeless tobacco use includes Chew. He reports that he drinks alcohol. He reports that he does not use illicit drugs. family history includes Aneurysm in his father; Cancer in his maternal aunt, maternal uncle, and sister; Diabetes in his mother; Heart disease (age of onset: 51) in his mother; Hyperlipidemia in his father; Hypertension in his father. No Known Allergies    Review of Systems  Constitutional: Negative for fever and chills.  Respiratory: Negative for shortness of breath.   Cardiovascular: Negative for chest pain.  Gastrointestinal: Positive for constipation. Negative for  nausea, vomiting, diarrhea and blood in stool.       Objective:   Physical Exam  Constitutional: He appears well-developed and well-nourished.  Cardiovascular: Normal rate and regular rhythm.   Pulmonary/Chest: Effort normal and breath sounds normal. No respiratory distress. He has no wheezes. He has no rales.  Abdominal: Soft. Bowel sounds are normal. He exhibits no distension and no mass. There is no tenderness. There is no rebound and no guarding.  Incision sites appear to be healing well. Abdomen soft nontender  Genitourinary:  Small anal fissure around the 12:00 position. No active bleeding.          Assessment & Plan:  Anal fissure following recent abdominal surgery and pain medications. His constipation is improving. We recommend regular use of stool softener for the next couple weeks along with regular sitz baths and high fiber and plenty of fluids.

## 2015-03-22 ENCOUNTER — Other Ambulatory Visit: Payer: Self-pay | Admitting: Family Medicine

## 2015-04-07 ENCOUNTER — Other Ambulatory Visit: Payer: Self-pay | Admitting: Adult Health

## 2015-04-08 ENCOUNTER — Other Ambulatory Visit: Payer: Self-pay | Admitting: Family Medicine

## 2015-05-09 ENCOUNTER — Other Ambulatory Visit: Payer: Self-pay | Admitting: Family Medicine

## 2015-08-10 ENCOUNTER — Other Ambulatory Visit: Payer: Self-pay | Admitting: Family Medicine

## 2015-08-30 ENCOUNTER — Other Ambulatory Visit: Payer: Self-pay | Admitting: Family Medicine

## 2015-09-23 ENCOUNTER — Other Ambulatory Visit: Payer: Self-pay | Admitting: Family Medicine

## 2015-10-17 ENCOUNTER — Other Ambulatory Visit: Payer: Self-pay | Admitting: Family Medicine

## 2015-11-02 ENCOUNTER — Telehealth: Payer: Self-pay | Admitting: Family Medicine

## 2015-11-02 MED ORDER — FENOFIBRATE 145 MG PO TABS: ORAL_TABLET | ORAL | Status: DC

## 2015-11-02 MED ORDER — AMLODIPINE BESYLATE 10 MG PO TABS: ORAL_TABLET | ORAL | Status: DC

## 2015-11-02 MED ORDER — OLMESARTAN MEDOXOMIL-HCTZ 40-25 MG PO TABS: 1.0000 | ORAL_TABLET | Freq: Every day | ORAL | Status: DC

## 2015-11-02 MED ORDER — GLIPIZIDE 5 MG PO TABS: ORAL_TABLET | ORAL | Status: DC

## 2015-11-02 NOTE — Telephone Encounter (Signed)
Pt has switched pharmacies and needs all scripts sent to [facility] (from now on) Pt needs refills of olmesartan-hydrochlorothiazide (BENICAR HCT) 40-25 MG per tablet [id number] amLODipine (NORVASC) 10 MG tablet [id number] glipiZIDE (GLUCOTROL) 5 MG tablet [id number] fenofibrate (TRICOR) 145 MG tablet [id number]  metFORMIN (GLUCOPHAGE) 500 MG tablet [id number]  Sent to [facility]

## 2015-11-03 MED ORDER — METFORMIN HCL 500 MG PO TABS: ORAL_TABLET | ORAL | Status: DC

## 2015-11-03 NOTE — Telephone Encounter (Signed)
Medication re-sent into the pharmacy.  

## 2015-11-03 NOTE — Addendum Note (Signed)
Addended by: Elio Forget on: 11/03/2015 04:47 PM   Modules accepted: Orders

## 2015-11-03 NOTE — Telephone Encounter (Signed)
Patient states metformin was not sent to [facility] as requested yesterday.  Please sent refill to [facility] today.

## 2015-11-14 IMAGING — CT CT ABD-PELV W/ CM
2 of 5 series · 16 of 46 positions shown, 18 images · IV contrast (omnipaque)
Comparison: Prior CT abdomen/ pelvis 05/05/2014

CLINICAL DATA: 54-year-old male with 1 day history of abdominal
pain. History of prostate cancer.

EXAM:
CT ABDOMEN AND PELVIS WITH CONTRAST
TECHNIQUE: Multidetector CT imaging of the abdomen and pelvis was performed
using the standard protocol following bolus administration of
intravenous contrast.
CONTRAST:  100mL OMNIPAQUE IOHEXOL 300 MG/ML  SOLN

[Series 2: abd/pel with · axial · 0.86mm/px · z∈[-216,+224]mm · 13 of 98 slices shown, 15 images]
[im 5/98  soft-tissue]
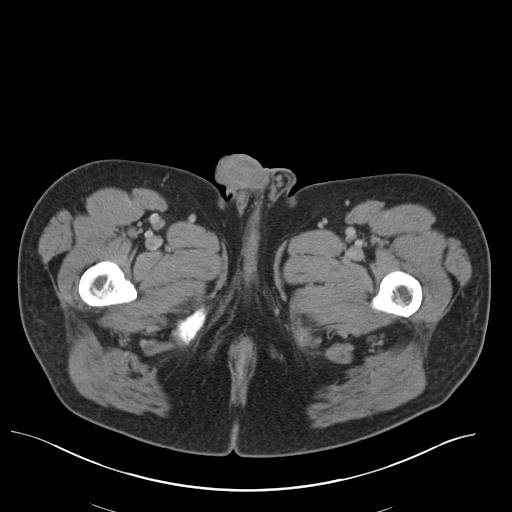
[im 5/98  bone]
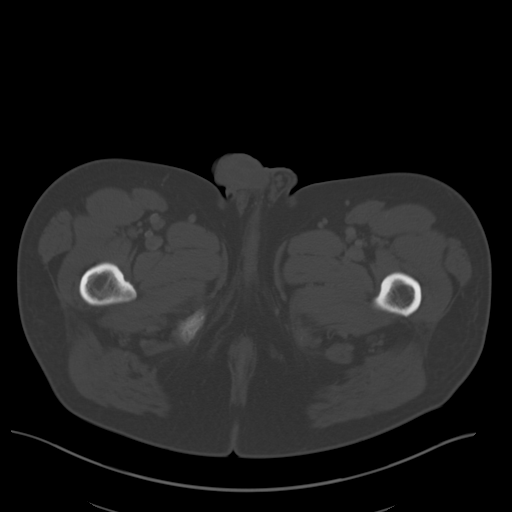
[im 15/98  soft-tissue]
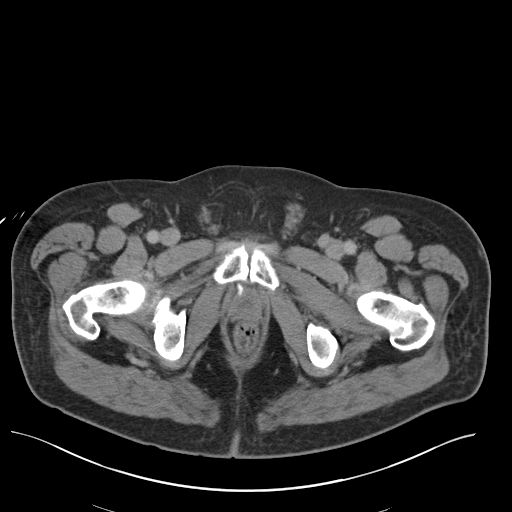
[im 20/98  soft-tissue]
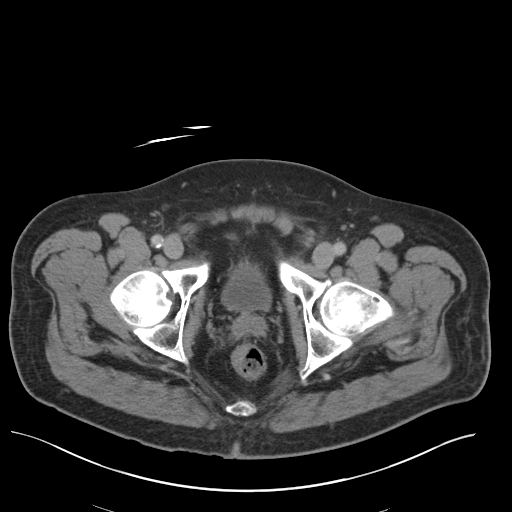
[im 30/98  soft-tissue]
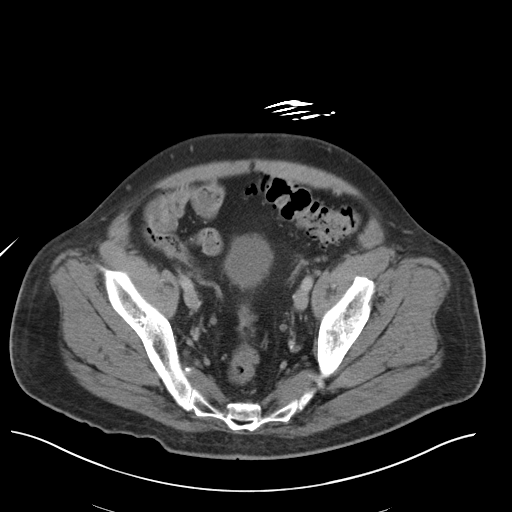
[im 34/98  soft-tissue]
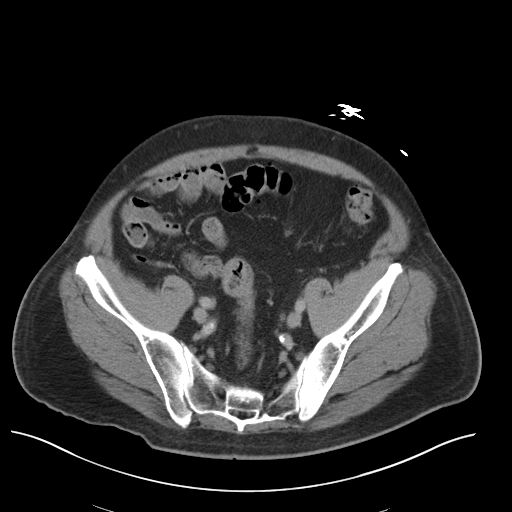
[im 44/98  soft-tissue]
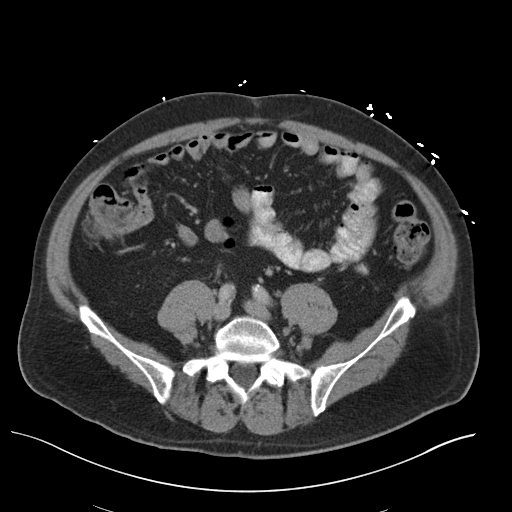
[im 49/98  soft-tissue]
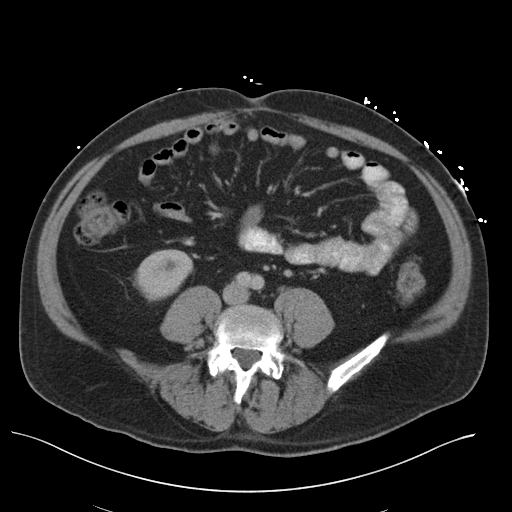
[im 54/98  soft-tissue]
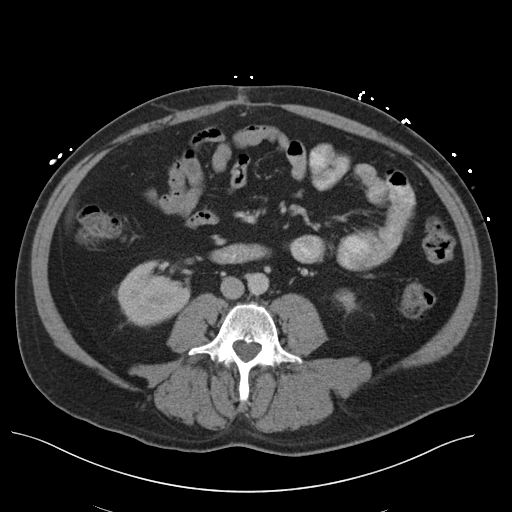
[im 64/98  soft-tissue]
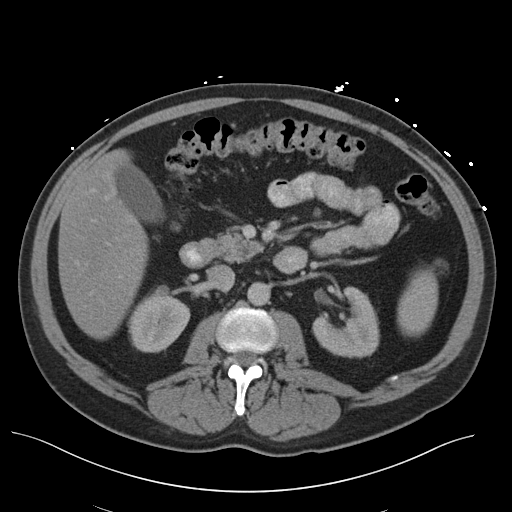
[im 64/98  bone]
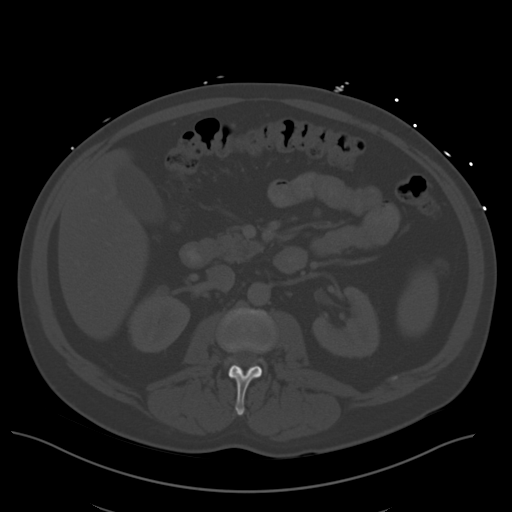
[im 68/98  soft-tissue]
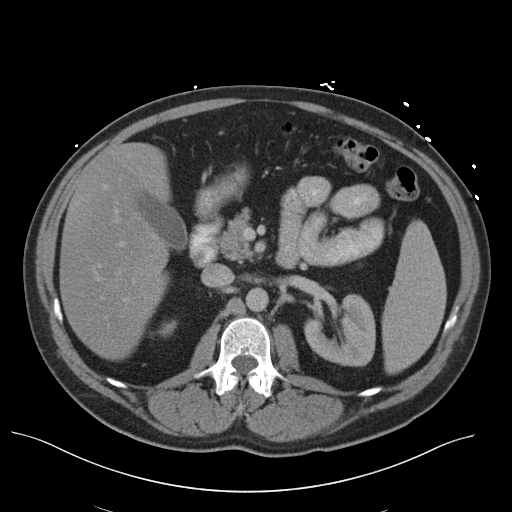
[im 78/98  soft-tissue]
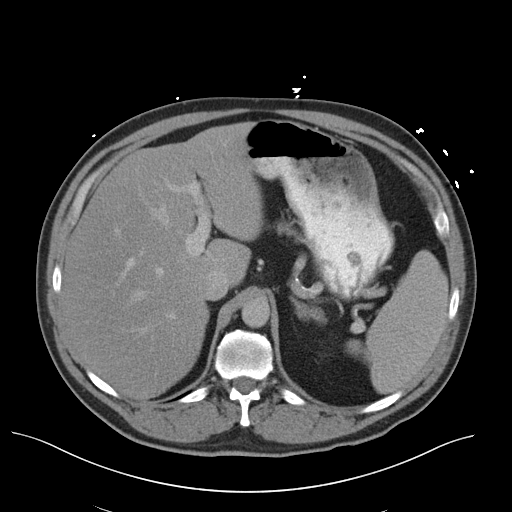
[im 83/98  soft-tissue]
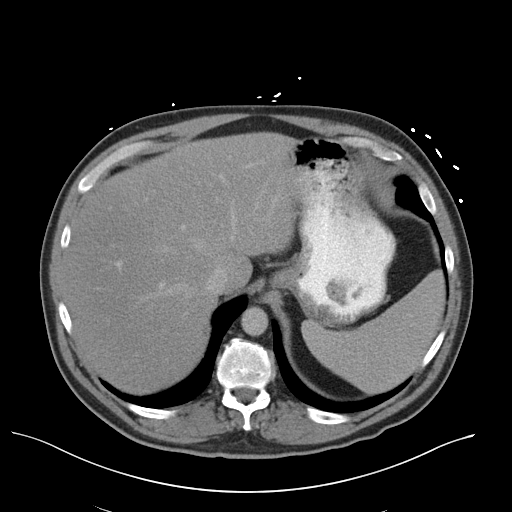
[im 93/98  soft-tissue]
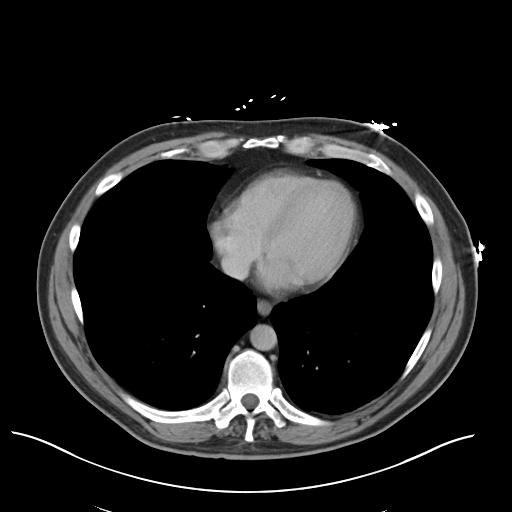

[Series 4: coronal a/|p · coronal · 0.81mm/px · 3 of 104 slices shown]
[im 35/104  soft-tissue]
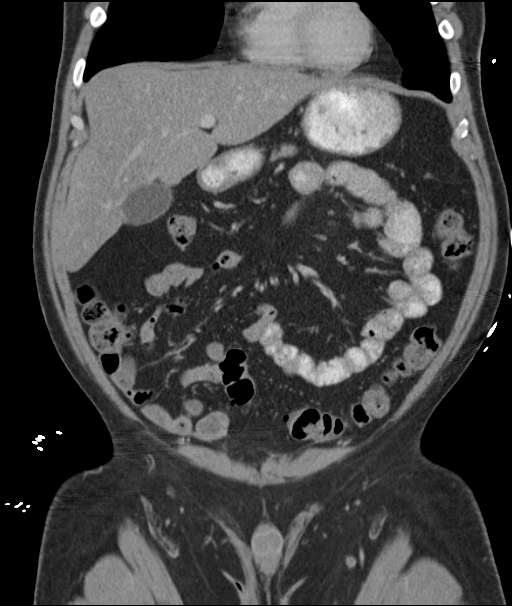
[im 46/104  soft-tissue]
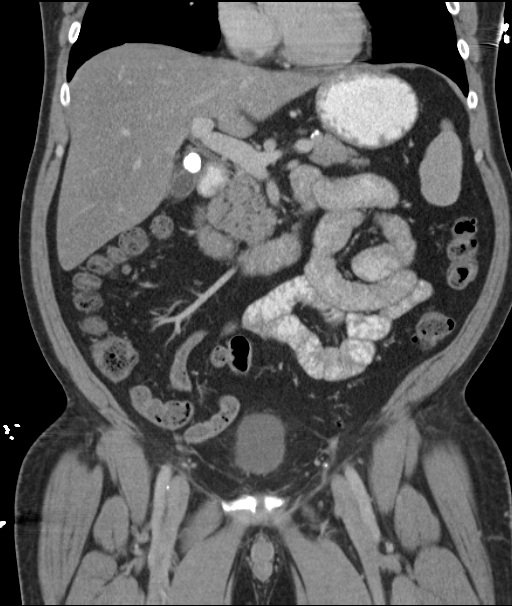
[im 58/104  soft-tissue]
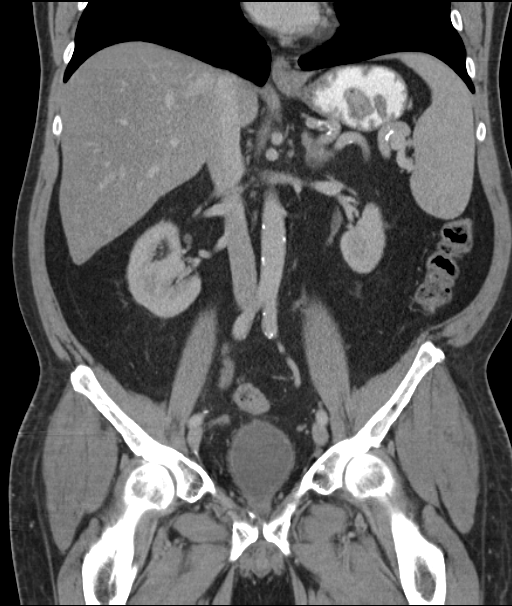

[16 of 46 positions shown; findings below may reference images not displayed]

FINDINGS: Lower Chest: Isolated emphysematous bleb versus pulmonary cyst in
the periphery of the right lower lobe. Otherwise, the lungs are
clear. Visualized cardiac structures within normal limits for size.
Unremarkable distal thoracic esophagus.

Abdomen: Unremarkable CT appearance of the stomach, duodenum,
spleen, adrenal glands and pancreas. Normal hepatic contour and
morphology. No discrete hepatic lesion. Cholelithiasis in the
gallbladder neck. No evidence of gallbladder wall thickening or
pericholecystic fluid to suggest cholecystitis.

Unremarkable appearance of the bilateral kidneys. No focal solid
lesion, hydronephrosis or nephrolithiasis.

Colonic diverticular disease without CT evidence of active
inflammation. No evidence of obstruction or focal bowel wall
thickening. Normal appendix in the right lower quadrant. The
terminal ileum is unremarkable. No free fluid or suspicious
adenopathy.

Pelvis: Atretic prostate gland versus prior surgical resection. The
bladder is under distended with diffuse mild thickening of the wall.
No free fluid or suspicious adenopathy.

Bones/Soft Tissues: No acute fracture or aggressive appearing lytic
or blastic osseous lesion. Stable patchy sclerosis in the left iliac
bone. There was no evidence of metabolic activity on prior bone
scan. Tiny nonspecific nodular soft tissue just superior to the
umbilicus likely represents scar tissue or small granuloma at the
site of prior healed incision.

Vascular: Atherosclerotic vascular disease without significant
stenosis or aneurysmal dilatation.
IMPRESSION: 1. No acute abnormality in the abdomen or pelvis.
2. Cholelithiasis.
3. Atherosclerotic vascular calcifications.

## 2015-12-19 ENCOUNTER — Ambulatory Visit (INDEPENDENT_AMBULATORY_CARE_PROVIDER_SITE_OTHER): Payer: BLUE CROSS/BLUE SHIELD | Admitting: Family Medicine

## 2015-12-19 ENCOUNTER — Encounter: Payer: Self-pay | Admitting: Family Medicine

## 2015-12-19 ENCOUNTER — Ambulatory Visit (INDEPENDENT_AMBULATORY_CARE_PROVIDER_SITE_OTHER): Payer: BLUE CROSS/BLUE SHIELD

## 2015-12-19 VITALS — BP 104/68 | HR 82 | Wt 220.0 lb

## 2015-12-19 DIAGNOSIS — M25562 Pain in left knee: Secondary | ICD-10-CM

## 2015-12-19 DIAGNOSIS — M25561 Pain in right knee: Secondary | ICD-10-CM | POA: Diagnosis not present

## 2015-12-19 NOTE — Patient Instructions (Signed)
Thank you for coming in today. Call or go to the ER if you develop a large red swollen joint with extreme pain or oozing puss.  Return in 2-4 weeks.   Meniscus Tear With Phase I Rehab The meniscus is a C-shaped cartilage structure, located in the knee joint between the thigh bone (femur) and the shinbone (tibia). Two menisci are located in each knee joint: the inner and outer meniscus. The meniscus acts as an adapter between the thigh bone and shinbone, allowing them to fit properly together. It also functions as a shock absorber, to reduce the stress placed on the knee joint and to help supply nutrients to the knee joint cartilage. As people age, the meniscus begins to harden and become more vulnerable to injury. Meniscus tears are a common injury, especially in older athletes. Inner meniscus tears are more common than outer meniscus tears.  SYMPTOMS   Pain in the knee, especially with standing or squatting with the affected leg.  Tenderness along the joint line.  Swelling in the knee joint (effusion), usually starting 1 to 2 days after injury.  Locking or catching of the knee joint, causing inability to straighten the knee completely.  Giving way or buckling of the knee. CAUSES  A meniscus tear occurs when a force is placed on the meniscus that is greater than it can handle. Common causes of injury include:  Direct hit (trauma) to the knee.  Twisting, pivoting, or cutting (rapidly changing direction while running), kneeling or squatting.  Without injury, due to aging. RISK INCREASES WITH:  Contact sports (football, rugby).  Sports in which cleats are used with pivoting (soccer, lacrosse) or sports in which good shoe grip and sudden change in direction are required (racquetball, basketball, squash).  Previous knee injury.  Associated knee injury, particularly ligament injuries.  Poor strength and flexibility. PREVENTION  Warm up and stretch properly before activity.  Maintain  physical fitness:  Strength, flexibility, and endurance.  Cardiovascular fitness.  Protect the knee with a brace or elastic bandage.  Wear properly fitted protective equipment (proper cleats for the surface). PROGNOSIS  Sometimes, meniscus tears heal on their own. However, definitive treatment requires surgery, followed by at least 6 weeks of recovery.  RELATED COMPLICATIONS   Recurring symptoms that result in a chronic problem.  Repeated knee injury, especially if sports are resumed too soon after injury or surgery.  Progression of the tear (the tear gets larger), if untreated.  Arthritis of the knee in later years (with or without surgery).  Complications of surgery, including infection, bleeding, injury to nerves (numbness, weakness, paralysis) continued pain, giving way, locking, nonhealing of meniscus (if repaired), need for further surgery, and knee stiffness (loss of motion). TREATMENT  Treatment first involves the use of ice and medicine, to reduce pain and inflammation. You may find using crutches to walk more comfortable. However, it is okay to bear weight on the injured knee, if the pain will allow it. Surgery is often advised as a definitive treatment. Surgery is performed through an incision near the joint (arthroscopically). The torn piece of the meniscus is removed, and if possible the joint cartilage is repaired. After surgery, the joint must be restrained. After restraint, it is important to perform strengthening and stretching exercises to help regain strength and a full range of motion. These exercises may be completed at home or with a therapist.  MEDICATION  If pain medicine is needed, nonsteroidal anti-inflammatory medicines (aspirin and ibuprofen), or other minor pain relievers (acetaminophen),  are often advised.  Do not take pain medicine for 7 days before surgery.  Prescription pain relievers may be given, if your caregiver thinks they are needed. Use only as  directed and only as much as you need. HEAT AND COLD  Cold treatment (icing) should be applied for 10 to 15 minutes every 2 to 3 hours for inflammation and pain, and immediately after activity that aggravates your symptoms. Use ice packs or an ice massage.  Heat treatment may be used before performing stretching and strengthening activities prescribed by your caregiver, physical therapist, or athletic trainer. Use a heat pack or a warm water soak. SEEK MEDICAL CARE IF:   Symptoms get worse or do not improve in 2 weeks, despite treatment.  New, unexplained symptoms develop. (Drugs used in treatment may produce side effects.) EXERCISES RANGE OF MOTION (ROM) AND STRETCHING EXERCISES - Meniscus Tear, Non-operative, Phase I These are some of the initial exercises with which you may start your rehabilitation program, until you see your caregiver again or until your symptoms are resolved. Remember:   These initial exercises are intended to be gentle. They will help you restore motion without increasing any swelling.  Completing these exercises allows less painful movement and prepares you for the more aggressive strengthening exercises in Phase II.  An effective stretch should be held for at least 30 seconds.  A stretch should never be painful. You should only feel a gentle lengthening or release in the stretched tissue. RANGE OF MOTION - Knee Flexion, Active  Lie on your back with both knees straight. (If this causes back discomfort, bend your healthy knee, placing your foot flat on the floor.)  Slowly slide your heel back toward your buttocks until you feel a gentle stretch in the front of your knee or thigh.  Hold for __________ seconds. Slowly slide your heel back to the starting position. Repeat __________ times. Complete this exercise __________ times per day.  RANGE OF MOTION - Knee Flexion and Extension, Active-Assisted  Sit on the edge of a table or chair with your thighs firmly  supported. It may be helpful to place a folded towel under the end of your right / left thigh.  Flexion (bending): Place the ankle of your healthy leg on top of the other ankle. Use your healthy leg to gently bend your right / left knee until you feel a mild tension across the top of your knee.  Hold for __________ seconds.  Extension (straightening): Switch your ankles so your right / left leg is on top. Use your healthy leg to straighten your right / left knee until you feel a mild tension on the backside of your knee.  Hold for __________ seconds. Repeat __________ times. Complete __________ times per day. STRETCH - Knee Flexion, Supine  Lie on the floor with your right / left heel and foot lightly touching the wall. (Place both feet on the wall if you do not use a door frame.)  Without using any effort, allow gravity to slide your foot down the wall slowly until you feel a gentle stretch in the front of your right / left knee.  Hold this stretch for __________ seconds. Then return the leg to the starting position, using your healthy leg for help, if needed. Repeat __________ times. Complete this stretch __________ times per day.  STRETCH - Knee Extension Sitting  Sit with your right / left leg/heel propped on another chair, coffee table, or foot stool.  Allow your leg muscles  to relax, letting gravity straighten out your knee.*  You should feel a stretch behind your right / left knee. Hold this position for __________ seconds. Repeat __________ times. Complete this stretch __________ times per day.  *Your physician, physical therapist or athletic trainer may instruct you place a __________ weight on your thigh, just above your kneecap, to deepen the stretch.  STRENGTHENING EXERCISES - Meniscus Tear, Non-operative, Phase I These exercises may help you when beginning to rehabilitate your injury. They may resolve your symptoms with or without further involvement from your physician,  physical therapist or athletic trainer. While completing these exercises, remember:   Muscles can gain both the endurance and the strength needed for everyday activities through controlled exercises.  Complete these exercises as instructed by your physician, physical therapist or athletic trainer. Progress the resistance and repetitions only as guided. STRENGTH - Quadriceps, Isometrics  Lie on your back with your right / left leg extended and your opposite knee bent.  Gradually tense the muscles in the front of your right / left thigh. You should see either your knee cap slide up toward your hip or increased dimpling just above the knee. This motion will push the back of the knee down toward the floor, mat, or bed on which you are lying.  Hold the muscle as tight as you can, without increasing your pain, for __________ seconds.  Relax the muscles slowly and completely between each repetition. Repeat __________ times. Complete this exercise __________ times per day.  STRENGTH - Quadriceps, Short Arcs   Lie on your back. Place a __________ inch towel roll under your right / left knee, so that the knee bends slightly.  Raise only your lower leg by tightening the muscles in the front of your thigh. Do not allow your thigh to rise.  Hold this position for __________ seconds. Repeat __________ times. Complete this exercise __________ times per day.  OPTIONAL ANKLE WEIGHTS: Begin with ____________________, but DO NOT exceed ____________________. Increase in 1 pound/0.5 kilogram increments. STRENGTH - Quadriceps, Straight Leg Raises  Quality counts! Watch for signs that the quadriceps muscle is working, to be sure you are strengthening the correct muscles and not "cheating" by substituting with healthier muscles.  Lay on your back with your right / left leg extended and your opposite knee bent.  Tense the muscles in the front of your right / left thigh. You should see either your knee cap slide  up or increased dimpling just above the knee. Your thigh may even shake a bit.  Tighten these muscles even more and raise your leg 4 to 6 inches off the floor. Hold for __________ seconds.  Keeping these muscles tense, lower your leg.  Relax the muscles slowly and completely in between each repetition. Repeat __________ times. Complete this exercise __________ times per day.  STRENGTH - Hamstring, Curls   Lay on your stomach with your legs extended. (If you lay on a bed, your feet may hang over the edge.)  Tighten the muscles in the back of your thigh to bend your right / left knee up to 90 degrees. Keep your hips flat on the bed.  Hold this position for __________ seconds.  Slowly lower your leg back to the starting position. Repeat __________ times. Complete this exercise __________ times per day.  STRENGTH - Quadriceps, Squats  Stand in a door frame so that your feet and knees are in line with the frame.  Use your hands for balance, not support, on the  frame.  Slowly lower your weight, bending at the hips and knees. Keep your lower legs upright so that they are parallel with the door frame. Squat only within the range that does not increase your knee pain. Never let your hips drop below your knees.  Slowly return upright, pushing with your legs, not pulling with your hands. Repeat __________ times. Complete this exercise __________ times per day.  STRENGTH - Quad/VMO, Isometric   Sit in a chair with your right / left knee slightly bent. With your fingertips, feel the VMO muscle just above the inside of your knee. The VMO is important in controlling the position of your kneecap.  Keeping your fingertips on this muscle. Without actually moving your leg, attempt to drive your knee down as if straightening your leg. You should feel your VMO tense. If you have a difficult time, you may wish to try the same exercise on your healthy knee first.  Tense this muscle as hard as you can  without increasing any knee pain.  Hold for __________ seconds. Relax the muscles slowly and completely in between each repetition. Repeat __________ times. Complete exercise __________ times per day.    This information is not intended to replace advice given to you by your health care provider. Make sure you discuss any questions you have with your health care provider.   Document Released: 08/27/1998 Document Revised: 12/28/2014 Document Reviewed: 11/25/2008 Elsevier Interactive Patient Education [employer].

## 2015-12-19 NOTE — Progress Notes (Signed)
Subjective:    I'm seeing this patient as a consultation for:  Derek Post, MD   CC: Left knee pain  HPI: Patient notes a several week history of left knee pain. Pain occurred without injury. Pain is located anteriorly and worse with knee flexion. His knee feels full. No radiating pain weakness or numbness. He tried some over-the-counter medicines have not helped much.  Past medical history, Surgical history, Family history not pertinant except as noted below, Social history, Allergies, and medications have been entered into the medical record, reviewed, and no changes needed.   Review of Systems: No headache, visual changes, nausea, vomiting, diarrhea, constipation, dizziness, abdominal pain, skin rash, fevers, chills, night sweats, weight loss, swollen lymph nodes, body aches, joint swelling, muscle aches, chest pain, shortness of breath, mood changes, visual or auditory hallucinations.   Objective:    Filed Vitals:   12/19/15 1532  BP: 104/68  Pulse: 82   General: Well Developed, well nourished, and in no acute distress.  Neuro/Psych: Alert and oriented x3, extra-ocular muscles intact, able to move all 4 extremities, sensation grossly intact. Skin: Warm and dry, no rashes noted.  Respiratory: Not using accessory muscles, speaking in full sentences, trachea midline.  Cardiovascular: Pulses palpable, no extremity edema. Abdomen: Does not appear distended. MSK: Knee mild effusion. No erythema Range of motion 0-100. Nontender Stable ligamentous exam.   Procedure: Real-time Ultrasound Guided Injection of left knee  Device: GE Logiq E  Images permanently stored and available for review in the ultrasound unit. Verbal informed consent obtained. Discussed risks and benefits of procedure. Warned about infection bleeding damage to structures skin hypopigmentation and fat atrophy among others. Patient expresses understanding and agreement Time-out conducted.  Noted no  overlying erythema, induration, or other signs of local infection.  Skin prepped in a sterile fashion.  Local anesthesia: Topical Ethyl chloride.  With sterile technique and under real time ultrasound guidance: 80 mg of Kenalog and 4 mL of Marcaine injected easily.  Completed without difficulty  Pain immediately resolved suggesting accurate placement of the medication.  Advised to call if fevers/chills, erythema, induration, drainage, or persistent bleeding.  Images permanently stored and available for review in the ultrasound unit.  Impression: Technically successful ultrasound guided injection.    No results found for this or any previous visit (from the past 24 hour(s)). Dg Knee 1-2 Views Right  12/19/2015  CLINICAL DATA:  Left knee pain, 3 weeks duration. EXAM: RIGHT KNEE - 1-2 VIEW COMPARISON:  None. FINDINGS: Standing AP view of the right knee shows normal medial and lateral joint spaces without focal abnormality. One view only obtained. IMPRESSION: Normal standing frontal view of the right knee. Electronically Signed   By: Nelson Chimes M.D.   On: 12/19/2015 16:14   Dg Knee Complete 4 Views Left  12/19/2015  CLINICAL DATA:  Left knee pain, 3 weeks duration. EXAM: LEFT KNEE - COMPLETE 4+ VIEW COMPARISON:  None. FINDINGS: Normal joint space heights on the frontal standing view. There is a small knee joint effusion. Patellofemoral joint appears normal. No other focal bone finding. Patient has premature arterial calcification for age. IMPRESSION: Small joint effusion. No bone abnormality. No joint space narrowing. Premature vascular calcification. Electronically Signed   By: Nelson Chimes M.D.   On: 12/19/2015 16:15    Impression and Recommendations:   55 year old male with left knee pain. Probable meniscus injury. Trial of steroid injection. Patient had significant improvement with injection likely due to the Marcaine. This indicates an interarticular  cause. Plan to follow-up in 2-4  weeks if not better at that time would consider MRI.  This case required medical decision making of moderate complexity.

## 2015-12-20 ENCOUNTER — Encounter: Payer: BLUE CROSS/BLUE SHIELD | Admitting: Family Medicine

## 2015-12-20 NOTE — Progress Notes (Signed)
Quick Note:  Xray shows some arthritis ______ 

## 2015-12-23 ENCOUNTER — Telehealth: Payer: Self-pay

## 2016-03-20 ENCOUNTER — Other Ambulatory Visit: Payer: Self-pay | Admitting: Family Medicine

## 2016-03-20 LAB — HM DIABETES EYE EXAM

## 2016-04-02 ENCOUNTER — Encounter: Payer: Self-pay | Admitting: Family Medicine

## 2016-05-02 ENCOUNTER — Other Ambulatory Visit: Payer: Self-pay | Admitting: Family Medicine

## 2016-05-02 NOTE — Telephone Encounter (Signed)
Rx refill sent to pharmacy. 

## 2016-05-09 ENCOUNTER — Ambulatory Visit (INDEPENDENT_AMBULATORY_CARE_PROVIDER_SITE_OTHER): Payer: BLUE CROSS/BLUE SHIELD | Admitting: Family Medicine

## 2016-05-09 VITALS — BP 110/80 | HR 100 | Temp 98.2°F | Ht 69.0 in | Wt 219.0 lb

## 2016-05-09 DIAGNOSIS — E1165 Type 2 diabetes mellitus with hyperglycemia: Secondary | ICD-10-CM | POA: Diagnosis not present

## 2016-05-09 DIAGNOSIS — I10 Essential (primary) hypertension: Secondary | ICD-10-CM | POA: Diagnosis not present

## 2016-05-09 DIAGNOSIS — IMO0001 Reserved for inherently not codable concepts without codable children: Secondary | ICD-10-CM

## 2016-05-09 DIAGNOSIS — M25512 Pain in left shoulder: Secondary | ICD-10-CM | POA: Diagnosis not present

## 2016-05-09 DIAGNOSIS — E785 Hyperlipidemia, unspecified: Secondary | ICD-10-CM

## 2016-05-09 DIAGNOSIS — L821 Other seborrheic keratosis: Secondary | ICD-10-CM

## 2016-05-09 DIAGNOSIS — L600 Ingrowing nail: Secondary | ICD-10-CM

## 2016-05-09 NOTE — Progress Notes (Signed)
Pre visit review using our clinic review tool, if applicable. No additional management support is needed unless otherwise documented below in the visit note. 

## 2016-05-09 NOTE — Progress Notes (Signed)
Subjective:     Patient ID: [name], male   DOB: February 18, 1961, 55 y.o.   MRN: [id number]  HPI Patient seen with multiple issues today. Chronic problems include history of hypertension, prostate cancer, type 2 diabetes, dyslipidemia. He is currently on Lupron injections for his prostate cancer and is doing fairly well. PSA has been very low. He has several issues today to discuss as follows  Type 2 diabetes. No recent follow-up. Last A1c 7.1% and this was over one year ago. Recent fasting blood sugars been elevated. Currently takes metformin and low-dose glipizide. No hypoglycemia. Recent eye exam no retinopathy  Hypertension. This has been stable. Compliant with medications. Remains on amlodipine and Benicar HCTZ.  Scaly skin lesion left anterior chest wall. Slowly growing in size.  Ingrown left great toenail. Present for several months. Has had previous excision with nail matrix ablation but nail grew back.  Left shoulder pain for about 3 months. Pain is worse with certain movements such as reaching overhead and internal rotation. No specific injury. No radiculopathy symptoms. No chest pains. Starting to impact activities and sleep more  Past Medical History:  Diagnosis Date  . Diabetes mellitus without complication ([place]) [id number]   type 2  . Hypertension   . Prostate cancer [facility])    Past Surgical History:  Procedure Laterality Date  . LYMPHADENECTOMY Bilateral 08/06/2014   Procedure: PELVIC LYMPH NODE DISSECTION;  Surgeon: Junious Dresser, MD;  Location: [facility];  Service: Urology;  Laterality: Bilateral;  . PROSTATE BIOPSY    . ROBOT ASSISTED LAPAROSCOPIC RADICAL PROSTATECTOMY N/A 08/06/2014   Procedure: ROBOTIC ASSISTED LAPAROSCOPIC RADICAL PROSTATECTOMY WITH INDOCYANINE GREEN DYE;  Surgeon: Junious Dresser, MD;  Location: [facility];  Service: Urology;  Laterality: N/A;  . TONSILLECTOMY      reports that he quit smoking about 31 years ago. His smoking use included Cigarettes.  He has a 10.00 pack-year smoking history. His smokeless tobacco use includes Chew. He reports that he drinks alcohol. He reports that he does not use drugs. family history includes Aneurysm in his father; Cancer in his maternal aunt, maternal uncle, and sister; Diabetes in his mother; Heart disease (age of onset: 73) in his mother; Hyperlipidemia in his father; Hypertension in his father. No Known Allergies   Review of Systems  Constitutional: Negative for fatigue.  Eyes: Negative for visual disturbance.  Respiratory: Negative for cough, chest tightness and shortness of breath.   Cardiovascular: Negative for chest pain, palpitations and leg swelling.  Genitourinary: Negative for dysuria.  Neurological: Negative for dizziness, syncope, weakness, light-headedness and headaches.       Objective:   Physical Exam  Constitutional: He appears well-developed and well-nourished.  Cardiovascular: Normal rate and regular rhythm.   Pulmonary/Chest: Effort normal and breath sounds normal. No respiratory distress. He has no wheezes. He has no rales.  Musculoskeletal:  Patient has pain with abduction left shoulder against resistance and internal rotation. No biceps tenderness. No acromioclavicular tenderness.  Skin:  Brown homogenous scaly well-demarcated 6 mm to 7 mm skin lesion left upper anterior chest wall  Left great toe slightly inflamed medial and lateral border. No granulation tissue. No cellulitis changes. Mildly tender to palpation. No paronychia.  Generally dry scan both legs       Assessment:     #1 type 2 diabetes. He has not had any recent follow-up  #2 hypertension stable and at goal  #3 history of dyslipidemia  #4 history of prostate cancer with previous  surgery, radiation therapy, and current hormonal therapy with Lupron  #5 benign appearing seborrheic keratosis chest wall  #6 ingrown left great toenail without evidence for acute infection at this time.  #7 left  shoulder pain. Suspect rotator cuff impingement    Plan:     -Check labs today with hemoglobin A1c, lipid panel, basic metabolic panel, hepatic panel -Discussed possible additional medications for diabetes if poorly controlled including GLP-1 or S GLT 2 class -Try over-the-counter Lac-Hydrin cream for moisturization of lower legs -Consider referral to podiatry regarding chronic problems with left ingrown toenail -Sports medicine referral reguarding left shoulder pain  [name] MD [facility]

## 2016-05-09 NOTE — Patient Instructions (Signed)
Consider OTC Lac-hydtrin as skin moisturizer. You have a benign appearing seborrheic keratosis of chest wall. We will call you with labs today.

## 2016-05-10 ENCOUNTER — Encounter: Payer: Self-pay | Admitting: Podiatry

## 2016-05-10 ENCOUNTER — Ambulatory Visit (INDEPENDENT_AMBULATORY_CARE_PROVIDER_SITE_OTHER): Payer: BLUE CROSS/BLUE SHIELD | Admitting: Podiatry

## 2016-05-10 VITALS — BP 122/78 | HR 77 | Ht 69.0 in | Wt 219.0 lb

## 2016-05-10 DIAGNOSIS — L600 Ingrowing nail: Secondary | ICD-10-CM

## 2016-05-10 DIAGNOSIS — M79672 Pain in left foot: Secondary | ICD-10-CM

## 2016-05-10 DIAGNOSIS — M79605 Pain in left leg: Secondary | ICD-10-CM

## 2016-05-10 DIAGNOSIS — L03032 Cellulitis of left toe: Secondary | ICD-10-CM | POA: Diagnosis not present

## 2016-05-10 LAB — HEMOGLOBIN A1C: Hgb A1c MFr Bld: 7.6 % — ABNORMAL HIGH (ref 4.6–6.5)

## 2016-05-10 LAB — HEPATIC FUNCTION PANEL
ALK PHOS: 50 U/L (ref 39–117)
ALT: 28 U/L (ref 0–53)
AST: 19 U/L (ref 0–37)
Albumin: 4.6 g/dL (ref 3.5–5.2)
BILIRUBIN DIRECT: 0.1 mg/dL (ref 0.0–0.3)
BILIRUBIN TOTAL: 0.5 mg/dL (ref 0.2–1.2)
TOTAL PROTEIN: 7.4 g/dL (ref 6.0–8.3)

## 2016-05-10 LAB — LIPID PANEL
CHOL/HDL RATIO: 5
CHOLESTEROL: 158 mg/dL (ref 0–200)
HDL: 30.1 mg/dL — ABNORMAL LOW (ref >39.00)
NONHDL: 127.46
Triglycerides: 377 mg/dL — ABNORMAL HIGH (ref 0.0–149.0)
VLDL: 75.4 mg/dL — ABNORMAL HIGH (ref 0.0–40.0)

## 2016-05-10 LAB — BASIC METABOLIC PANEL
BUN: 22 mg/dL (ref 6–23)
CHLORIDE: 100 meq/L (ref 96–112)
CO2: 28 mEq/L (ref 19–32)
CREATININE: 1.04 mg/dL (ref 0.40–1.50)
Calcium: 9.8 mg/dL (ref 8.4–10.5)
GFR: 78.06 mL/min (ref >60.00)
Glucose, Bld: 199 mg/dL — ABNORMAL HIGH (ref 70–99)
Potassium: 3.8 mEq/L (ref 3.5–5.1)
Sodium: 137 mEq/L (ref 135–145)

## 2016-05-10 LAB — LDL CHOLESTEROL, DIRECT: Direct LDL: 82 mg/dL

## 2016-05-10 NOTE — Progress Notes (Signed)
SUBJECTIVE: 55 y.o. year old male presents complaining of ingrown nail painful x 6 weeks. Patient requests surgical intervention to prevent recurring problem. Stated that he has had the same problem in past on right great toe that was resolved with permanent nail procedure.    REVIEW OF SYSTEMS: Pertinent items noted in HPI and remainder of comprehensive ROS otherwise negative.  OBJECTIVE: DERMATOLOGIC EXAMINATION: Draining ingrown nail on both borders of left great toe, painful to walk.  VASCULAR EXAMINATION OF LOWER LIMBS: Pedal pulses: All pedal pulses are palpable with normal pulsation.  Temperature gradient from tibial crest to dorsum of foot is within normal bilateral.  NEUROLOGIC EXAMINATION OF THE LOWER LIMBS: All epicritic and tactile sensations grossly intact.   MUSCULOSKELETAL EXAMINATION: No gross deformities noted.   ASSESSMENT: Infected ingrown nail, painful left hallux both borders.  PLAN: Reviewed findings and available treatment options. As per request Phenol and Alcohol matrixectomy done on left great toe both borders. Affected left great toe was anesthetized with total 69ml mixture of 50/50 0.5% Marcaine plain and 1% Xylocaine plain. Affected nail borders were reflected with a nail elevator and excised with nail nipper. Proximal nail matrix tissue was cauterized with Phenol soaked cotton applicator x 4 and neutralized with Alcohol soaked cotton applicator. The wound was dressed with Amerigel ointment dressing. Home care instructions and supply dispensed.  Return in 1 week for follow up.

## 2016-05-10 NOTE — Patient Instructions (Signed)
Ingrown nail surgery was done on left big toe both borders. Follow soaking instruction.  Some redness and drainage is expected. Call the office if the area gets feverish with increased redness and drainage.

## 2016-05-16 ENCOUNTER — Telehealth: Payer: Self-pay | Admitting: Family Medicine

## 2016-05-16 NOTE — Telephone Encounter (Signed)
Spoke with pt

## 2016-05-16 NOTE — Telephone Encounter (Signed)
Pt returning your call. Will be in the car a few minutes

## 2016-05-17 ENCOUNTER — Encounter: Payer: Self-pay | Admitting: Podiatry

## 2016-05-17 ENCOUNTER — Other Ambulatory Visit: Payer: Self-pay

## 2016-05-17 ENCOUNTER — Ambulatory Visit (INDEPENDENT_AMBULATORY_CARE_PROVIDER_SITE_OTHER): Payer: BLUE CROSS/BLUE SHIELD | Admitting: Podiatry

## 2016-05-17 DIAGNOSIS — Z9889 Other specified postprocedural states: Secondary | ICD-10-CM

## 2016-05-17 MED ORDER — CANAGLIFLOZIN 100 MG PO TABS: 100.0000 mg | ORAL_TABLET | Freq: Every day | ORAL | 1 refills | Status: DC

## 2016-05-17 NOTE — Patient Instructions (Addendum)
Healing is slow but in progress. Continue with daily soaking. Use antibiotic cream as needed. Return or call if the area gets more red or drainage increase.

## 2016-05-17 NOTE — Progress Notes (Signed)
1 week post op following nail matrixectomy left great toe medial border.  Stated that he noted the toe become more red. He started on using antibitotic ointment that helped reducing redness.  He stopped soaking 2 days ago. He is on feet all day wearing closed in shoes.   Objective: Left great toe has mild drainage with inflamed ungual labia. Inflammation localized to immediate nail borders. No expanded cellulitis noted.  Assessment: Wound healing is slow but in progress.  Considering he is on feet all day, the wound healing is consistent with his activity.   Plan: Area cleansed with Betadine solution and DSD applied.  Instructed continue to soak and cleanse well. Use antibiotic ointment. If redness or drainage increase, call the office or come in.

## 2016-05-24 NOTE — Progress Notes (Signed)
[facility] [address], [place] [address] Phone: [phone] Subjective:    I'm seeing this patient by the request  of:  [name], MD   CC: Left shoulder pain  [id number]  [name] is a 55 y.o. male coming in with complaint of left shoulder pain been 2 months. Patient states this seems to be worsening. Starting wake him up at night. Seems to be localized to the shoulder area itself. No radiation down the arm. No numbness. No weakness. Patient states that certain things such as reaching across his body seems to be worsening.  Rates severity 8/10. Still able to do ADL's      Past Medical History:  Diagnosis Date  . Diabetes mellitus without complication ([place]) [id number]   type 2  . Hypertension   . Prostate cancer [facility])    Past Surgical History:  Procedure Laterality Date  . LYMPHADENECTOMY Bilateral 08/06/2014   Procedure: PELVIC LYMPH NODE DISSECTION;  Surgeon: Junious Dresser, MD;  [facility];  Service: Urology;  Laterality: Bilateral;  . PROSTATE BIOPSY    . ROBOT ASSISTED LAPAROSCOPIC RADICAL PROSTATECTOMY N/A 08/06/2014   Procedure: ROBOTIC ASSISTED LAPAROSCOPIC RADICAL PROSTATECTOMY WITH INDOCYANINE GREEN DYE;  Surgeon: Junious Dresser, MD;  [facility];  Service: Urology;  Laterality: N/A;  . TONSILLECTOMY     Social History   Social History  . Marital status: Married    Spouse name: N/A  . Number of children: N/A  . Years of education: N/A   Social History Main Topics  . Smoking status: Former Smoker    Packs/day: 1.00    Years: 10.00    Types: Cigarettes    Quit date: 06/30/1984  . Smokeless tobacco: Current User    Types: Chew  . Alcohol use Yes     Comment: 6 drinks per week - beer after work   . Drug use: No  . Sexual activity: Yes   Other Topics Concern  . Not on file   Social History Narrative  . No narrative on file   No Known Allergies Family History  Problem Relation  Age of Onset  . Diabetes Mother     type ll  . Heart disease Mother 63    CHF  . Hypertension Father   . Hyperlipidemia Father   . Aneurysm Father     AAA  . Cancer Sister     lung  . Cancer Maternal Aunt     colon  . Cancer Maternal Uncle     prostate    Past medical history, social, surgical and family history all reviewed in electronic medical record.  No pertanent information unless stated regarding to the chief complaint.   Review of Systems: No headache, visual changes, nausea, vomiting, diarrhea, constipation, dizziness, abdominal pain, skin rash, fevers, chills, night sweats, weight loss, swollen lymph nodes, body aches, joint swelling, muscle aches, chest pain, shortness of breath, mood changes.   Objective  There were no vitals taken for this visit.  General: No apparent distress alert and oriented x3 mood and affect normal, dressed appropriately.  HEENT: Pupils equal, extraocular movements intact  Respiratory: Patient's speak in full sentences and does not appear short of breath  Cardiovascular: No lower extremity edema, non tender, no erythema  Skin: Warm dry intact with no signs of infection or rash on extremities or on axial skeleton.  Abdomen: Soft nontender  Neuro: Cranial nerves II through XII are intact,  neurovascularly intact in all extremities with 2+ DTRs and 2+ pulses.  Lymph: No lymphadenopathy of posterior or anterior cervical chain or axillae bilaterally.  Gait normal with good balance and coordination.  MSK:  Non tender with full range of motion and good stability and symmetric strength and tone of  elbows, wrist, hip, knee and ankles bilaterally.  Shoulder: left Inspection reveals no abnormalities, atrophy or asymmetry. Palpation is normal with no tenderness over AC joint or bicipital groove. ROM is full in all planes passively. Rotator cuff strength normal throughout. signs of impingement with positive Neer and Hawkin's tests, but negative empty can  sign. Speeds and Yergason's tests normal. No labral pathology noted with negative Obrien's, negative clunk and good stability.Positive crossover Normal scapular function observed. No painful arc and no drop arm sign. No apprehension sign Contralateral shoulder unremarkable  MSK US performed of: left This study was ordered, performed, and interpreted by Charlann Boxer D.O.  Shoulder:   Supraspinatus:  Appears normal on long and transverse views, Bursal bulge seen with shoulder abduction on impingement view. Infraspinatus:  Appears normal on long and transverse views. Significant increase in Doppler flow Subscapularis:  Appears normal on long and transverse views. Positive bursa Teres Minor:  Appears normal on long and transverse views. AC joint:  Positive geyser sign with arthritic changes. Glenohumeral Joint:  Appears normal without effusion. Glenoid Labrum:  Intact without visualized tears. Biceps Tendon:  Appears normal on long and transverse views, no fraying of tendon, tendon located in intertubercular groove, no subluxation with shoulder internal or external rotation.  Impression: Subacromial bursitis with acromial clavicular arthritis  Procedure note [id number]; 15 minutes spent for Therapeutic exercises as stated in above notes.  This included exercises focusing on stretching, strengthening, with significant focus on eccentric aspects.Shoulder Exercises that included:  Basic scapular stabilization to include adduction and depression of scapula Scaption, focusing on proper movement and good control Internal and External rotation utilizing a theraband, with elbow tucked at side entire time Rows with theraband    Proper technique shown and discussed handout in great detail with ATC.  All questions were discussed and answered.       Impression and Recommendations:     This case required medical decision making of moderate complexity.      Note: This dictation was prepared with  Dragon dictation along with smaller phrase technology. Any transcriptional errors that result from this process are unintentional.

## 2016-05-25 ENCOUNTER — Other Ambulatory Visit: Payer: Self-pay

## 2016-05-25 ENCOUNTER — Ambulatory Visit (INDEPENDENT_AMBULATORY_CARE_PROVIDER_SITE_OTHER): Payer: BLUE CROSS/BLUE SHIELD | Admitting: Family Medicine

## 2016-05-25 ENCOUNTER — Encounter: Payer: Self-pay | Admitting: Family Medicine

## 2016-05-25 VITALS — BP 112/74 | HR 88 | Wt 219.0 lb

## 2016-05-25 DIAGNOSIS — M25512 Pain in left shoulder: Secondary | ICD-10-CM | POA: Diagnosis not present

## 2016-05-25 DIAGNOSIS — M19012 Primary osteoarthritis, left shoulder: Secondary | ICD-10-CM | POA: Insufficient documentation

## 2016-05-25 DIAGNOSIS — M1990 Unspecified osteoarthritis, unspecified site: Secondary | ICD-10-CM | POA: Diagnosis not present

## 2016-05-25 MED ORDER — DICLOFENAC SODIUM 2 % TD SOLN: 2.0000 "application " | Freq: Two times a day (BID) | TRANSDERMAL | 3 refills | Status: AC

## 2016-05-25 NOTE — Patient Instructions (Signed)
Good to see you.  Ice 20 minutes 2 times daily. Usually after activity and before bed. pennsaid pinkie amount topically 2 times daily as needed.  Avoid activity where hands are outside peripheral vision.  Exercises 3 times a week.  Watch sugars a little close with medicine  See me again in 3-4 weeks and if not better we will consider injection.

## 2016-05-25 NOTE — Assessment & Plan Note (Signed)
Moderate in severity. Mild underlying bursitis also noted of the shoulder. We discussed with patient at great length. Patient will try conservative therapy. We'll try topical anti-inflammatories over prescribed, icing protocol, home exercises. We discussed which activities to do an which was potentially avoid. Patient come back again in 4 weeks. At that time if continuing have pain we'll consider x-ray, injection and formal physical therapy.

## 2016-06-12 LAB — PSA

## 2016-06-21 NOTE — Progress Notes (Signed)
[facility] [address] [place], [place] [address] Phone: [phone] Subjective:    I'm seeing this patient by the request  of:  [name], MD   CC: Left shoulder pain f/u  [id number]  [name] is a 55 y.o. male coming in with complaint of left shoulder pain Was seen one month ago and was diagnosed with more of a acromioclavicular arthritis. Patient also had a mild subacromial bursitis. Given trial of topical anti-inflammatories, icing protocol, home exercises. Patient states Minimal improvement overall. Patient states it is still waking him up at night and affecting some daily activities. Patient has not been doing the exercises very regularly and has been noncompliant on the medications. Patient is getting ready to build eye house and does not know if he'll be able to do it with a significant amount of pain.      Past Medical History:  Diagnosis Date  . Diabetes mellitus without complication ([place]) [id number]   type 2  . Hypertension   . Prostate cancer [facility])    Past Surgical History:  Procedure Laterality Date  . LYMPHADENECTOMY Bilateral 08/06/2014   Procedure: PELVIC LYMPH NODE DISSECTION;  Surgeon: Junious Dresser, MD;  [facility];  Service: Urology;  Laterality: Bilateral;  . PROSTATE BIOPSY    . ROBOT ASSISTED LAPAROSCOPIC RADICAL PROSTATECTOMY N/A 08/06/2014   Procedure: ROBOTIC ASSISTED LAPAROSCOPIC RADICAL PROSTATECTOMY WITH INDOCYANINE GREEN DYE;  Surgeon: Junious Dresser, MD;  [facility];  Service: Urology;  Laterality: N/A;  . TONSILLECTOMY     Social History   Social History  . Marital status: Married    Spouse name: N/A  . Number of children: N/A  . Years of education: N/A   Social History Main Topics  . Smoking status: Former Smoker    Packs/day: 1.00    Years: 10.00    Types: Cigarettes    Quit date: 06/30/1984  . Smokeless tobacco: Current User    Types: Chew  . Alcohol use Yes   Comment: 6 drinks per week - beer after work   . Drug use: No  . Sexual activity: Yes   Other Topics Concern  . None   Social History Narrative  . None   No Known Allergies Family History  Problem Relation Age of Onset  . Diabetes Mother     type ll  . Heart disease Mother 2    CHF  . Hypertension Father   . Hyperlipidemia Father   . Aneurysm Father     AAA  . Cancer Sister     lung  . Cancer Maternal Aunt     colon  . Cancer Maternal Uncle     prostate    Past medical history, social, surgical and family history all reviewed in electronic medical record.  No pertanent information unless stated regarding to the chief complaint.   Review of Systems: No headache, visual changes, nausea, vomiting, diarrhea, constipation, dizziness, abdominal pain, skin rash, fevers, chills, night sweats, weight loss, swollen lymph nodes, body aches, joint swelling, muscle aches, chest pain, shortness of breath, mood changes.   Objective  Blood pressure 120/72, weight 220 lb (99.8 kg).  General: No apparent distress alert and oriented x3 mood and affect normal, dressed appropriately.  HEENT: Pupils equal, extraocular movements intact  Respiratory: Patient's speak in full sentences and does not appear short of breath  Cardiovascular: No lower extremity edema, non tender, no erythema  Skin: Warm dry intact  with no signs of infection or rash on extremities or on axial skeleton.  Abdomen: Soft nontender  Neuro: Cranial nerves II through XII are intact, neurovascularly intact in all extremities with 2+ DTRs and 2+ pulses.  Lymph: No lymphadenopathy of posterior or anterior cervical chain or axillae bilaterally.  Gait normal with good balance and coordination.  MSK:  Non tender with full range of motion and good stability and symmetric strength and tone of  elbows, wrist, hip, knee and ankles bilaterally.  Shoulder: left Inspection reveals no abnormalities, atrophy or asymmetry. His comfortably  acromial clavicular joint ROM is full in all planes passively. Rotator cuff strength normal throughout. signs of impingement with positive Neer and Hawkin's tests, but negative empty can sign. Speeds and Yergason's tests normal. No labral pathology noted with negative Obrien's, negative clunk and good stability.Positive crossover Normal scapular function observed. No painful arc and no drop arm sign. No apprehension sign Contralateral shoulder unremarkable  Procedure: Real-time Ultrasound Guided Injection of left acromioclavicular joint  Device: GE Logiq E  Ultrasound guided injection is preferred based studies that show increased duration, increased effect, greater accuracy, decreased procedural pain, increased response rate, and decreased cost with ultrasound guided versus blind injection.  Verbal informed consent obtained.  Time-out conducted.  Noted no overlying erythema, induration, or other signs of local infection.  Skin prepped in a sterile fashion.  Local anesthesia: Topical Ethyl chloride.  With sterile technique and under real time ultrasound guidance:  The 25-gauge half-inch needle patient was injected with a total of 0.5 mL of 0.5% Marcaine and 0.5 mL of Kenalog 41 g/dL. Completed without difficulty  Pain immediately resolved suggesting accurate placement of the medication.  Advised to call if fevers/chills, erythema, induration, drainage, or persistent bleeding.  Images permanently stored and available for review in the ultrasound unit.  Impression: Technically successful ultrasound guided injection.     Impression and Recommendations:     This case required medical decision making of moderate complexity.      Note: This dictation was prepared with Dragon dictation along with smaller phrase technology. Any transcriptional errors that result from this process are unintentional.

## 2016-06-22 ENCOUNTER — Ambulatory Visit (INDEPENDENT_AMBULATORY_CARE_PROVIDER_SITE_OTHER): Payer: BLUE CROSS/BLUE SHIELD | Admitting: Family Medicine

## 2016-06-22 ENCOUNTER — Encounter: Payer: Self-pay | Admitting: Family Medicine

## 2016-06-22 ENCOUNTER — Ambulatory Visit: Payer: Self-pay

## 2016-06-22 ENCOUNTER — Ambulatory Visit (INDEPENDENT_AMBULATORY_CARE_PROVIDER_SITE_OTHER)
Admission: RE | Admit: 2016-06-22 | Discharge: 2016-06-22 | Disposition: A | Discharge status: Home / Self Care | Payer: BLUE CROSS/BLUE SHIELD | Source: Ambulatory Visit | Attending: Family Medicine | Admitting: Family Medicine

## 2016-06-22 VITALS — BP 120/72 | Wt 220.0 lb

## 2016-06-22 DIAGNOSIS — M19012 Primary osteoarthritis, left shoulder: Secondary | ICD-10-CM | POA: Diagnosis not present

## 2016-06-22 NOTE — Patient Instructions (Addendum)
Good to see you  Ice is your friend always I will get you into PT  Xray downstairs Start exercises again on Monday 2-3 times a week See me again in 4-6 weeks Good luck with the cabin.

## 2016-06-22 NOTE — Assessment & Plan Note (Signed)
Worsening symptoms. Injected today. Tolerated procedure well. We discussed icing regimen and home exercise. Patient will be sent to formal physical therapy. X-rays pending for any other bony normality that can be contributing especially with patient's history of prostate cancer. Patient will come back and see me again 4-6 weeks.

## 2016-07-03 ENCOUNTER — Other Ambulatory Visit: Payer: Self-pay

## 2016-07-03 MED ORDER — GLIPIZIDE 5 MG PO TABS: ORAL_TABLET | ORAL | 2 refills | Status: DC

## 2016-07-06 ENCOUNTER — Other Ambulatory Visit: Payer: Self-pay | Admitting: Family Medicine

## 2016-07-26 NOTE — Progress Notes (Deleted)
[facility] [address], [place] [address] Phone: [phone] Subjective:    I'm seeing this patient by the request  of:  [name], MD   CC: Left shoulder pain f/u  [id number]  [name] is a 55 y.o. male coming in with complaint of left shoulder pain Was seen one month ago and did have acromioclavicular arthritis. Patient was given an injection. Patient was to do home exercises and consider formal physical therapy. Patient was to do topical anti-inflammatory's. Patient states  Patient did have x-rays of his shoulder on 06/22/2016. These were independently visualized by me showing very mild arthritic changes of the acromioclavicular joint but otherwise unremarkable.    Past Medical History:  Diagnosis Date  . Diabetes mellitus without complication ([place]) [id number]   type 2  . Hypertension   . Prostate cancer [facility])    Past Surgical History:  Procedure Laterality Date  . LYMPHADENECTOMY Bilateral 08/06/2014   Procedure: PELVIC LYMPH NODE DISSECTION;  Surgeon: Junious Dresser, MD;  Location: [facility];  Service: Urology;  Laterality: Bilateral;  . PROSTATE BIOPSY    . ROBOT ASSISTED LAPAROSCOPIC RADICAL PROSTATECTOMY N/A 08/06/2014   Procedure: ROBOTIC ASSISTED LAPAROSCOPIC RADICAL PROSTATECTOMY WITH INDOCYANINE GREEN DYE;  Surgeon: Junious Dresser, MD;  [facility]: [facility];  Service: Urology;  Laterality: N/A;  . TONSILLECTOMY     Social History   Social History  . Marital status: Married    Spouse name: N/A  . Number of children: N/A  . Years of education: N/A   Social History Main Topics  . Smoking status: Former Smoker    Packs/day: 1.00    Years: 10.00    Types: Cigarettes    Quit date: 06/30/1984  . Smokeless tobacco: Current User    Types: Chew  . Alcohol use Yes     Comment: 6 drinks per week - beer after work   . Drug use: No  . Sexual activity: Yes   Other Topics Concern  . Not on file   Social History  Narrative  . No narrative on file   No Known Allergies Family History  Problem Relation Age of Onset  . Diabetes Mother     type ll  . Heart disease Mother 78    CHF  . Hypertension Father   . Hyperlipidemia Father   . Aneurysm Father     AAA  . Cancer Sister     lung  . Cancer Maternal Aunt     colon  . Cancer Maternal Uncle     prostate    Past medical history, social, surgical and family history all reviewed in electronic medical record.  No pertanent information unless stated regarding to the chief complaint.   Review of Systems: No headache, visual changes, nausea, vomiting, diarrhea, constipation, dizziness, abdominal pain, skin rash, fevers, chills, night sweats, weight loss, swollen lymph nodes, body aches, joint swelling, muscle aches, chest pain, shortness of breath, mood changes.   Objective  There were no vitals taken for this visit.  Systems examined below as of 07/26/16 General: NAD A&O x3 mood, affect normal  HEENT: Pupils equal, extraocular movements intact no nystagmus Respiratory: not short of breath at rest or with speaking Cardiovascular: No lower extremity edema, non tender Skin: Warm dry intact with no signs of infection or rash on extremities or on axial skeleton. Abdomen: Soft nontender, no masses Neuro: Cranial nerves  intact, neurovascularly intact in all extremities with 2+  DTRs and 2+ pulses. Lymph: No lymphadenopathy appreciated today  Gait normal with good balance and coordination.  MSK: Non tender with full range of motion and good stability and symmetric strength and tone of  elbows, wrist,  knee hips and ankles bilaterally.   Shoulder: left Inspection reveals no abnormalities, atrophy or asymmetry. His comfortably acromial clavicular joint ROM is full in all planes passively. Rotator cuff strength normal throughout. signs of impingement with positive Neer and Hawkin's tests, but negative empty can sign. Speeds and Yergason's tests  normal. No labral pathology noted with negative Obrien's, negative clunk and good stability.Positive crossover Normal scapular function observed. No painful arc and no drop arm sign. No apprehension sign Contralateral shoulder unremarkable      Impression and Recommendations:     This case required medical decision making of moderate complexity.      Note: This dictation was prepared with Dragon dictation along with smaller phrase technology. Any transcriptional errors that result from this process are unintentional.

## 2016-07-27 ENCOUNTER — Ambulatory Visit: Payer: BLUE CROSS/BLUE SHIELD | Admitting: Family Medicine

## 2016-08-01 ENCOUNTER — Other Ambulatory Visit: Payer: Self-pay | Admitting: Family Medicine

## 2016-08-08 ENCOUNTER — Encounter: Payer: Self-pay | Admitting: Family Medicine

## 2016-08-08 ENCOUNTER — Ambulatory Visit (INDEPENDENT_AMBULATORY_CARE_PROVIDER_SITE_OTHER): Payer: BLUE CROSS/BLUE SHIELD | Admitting: Family Medicine

## 2016-08-08 VITALS — BP 136/80 | HR 88 | Temp 97.5°F | Wt 223.5 lb

## 2016-08-08 DIAGNOSIS — IMO0001 Reserved for inherently not codable concepts without codable children: Secondary | ICD-10-CM

## 2016-08-08 DIAGNOSIS — E1165 Type 2 diabetes mellitus with hyperglycemia: Secondary | ICD-10-CM | POA: Diagnosis not present

## 2016-08-08 LAB — POCT GLYCOSYLATED HEMOGLOBIN (HGB A1C): Hemoglobin A1C: 6.3

## 2016-08-08 MED ORDER — EMPAGLIFLOZIN 10 MG PO TABS: 10.0000 mg | ORAL_TABLET | Freq: Every day | ORAL | 3 refills | Status: DC

## 2016-08-08 NOTE — Progress Notes (Signed)
Pre visit review using our clinic review tool, if applicable. No additional management support is needed unless otherwise documented below in the visit note. 

## 2016-08-08 NOTE — Progress Notes (Signed)
Subjective:     Patient ID: [name], male   DOB: 12-Aug-1961, 55 y.o.   MRN: [id number]  HPI This is seen for follow-up type 2 diabetes. Back in September A1c 7.6%. He started invokana. He had some urine frequency, as expected but no other side effects. Fasting blood sugars have overall improved. Denies any other side effects such as dysuria. Other labs were stable.  Past Medical History:  Diagnosis Date  . Diabetes mellitus without complication ([place]) [id number]   type 2  . Hypertension   . Prostate cancer [facility])    Past Surgical History:  Procedure Laterality Date  . LYMPHADENECTOMY Bilateral 08/06/2014   Procedure: PELVIC LYMPH NODE DISSECTION;  Surgeon: Junious Dresser, MD;  [facility];  Service: Urology;  Laterality: Bilateral;  . PROSTATE BIOPSY    . ROBOT ASSISTED LAPAROSCOPIC RADICAL PROSTATECTOMY N/A 08/06/2014   Procedure: ROBOTIC ASSISTED LAPAROSCOPIC RADICAL PROSTATECTOMY WITH INDOCYANINE GREEN DYE;  Surgeon: Junious Dresser, MD;  [facility];  Service: Urology;  Laterality: N/A;  . TONSILLECTOMY      reports that he quit smoking about 32 years ago. His smoking use included Cigarettes. He has a 10.00 pack-year smoking history. His smokeless tobacco use includes Chew. He reports that he drinks alcohol. He reports that he does not use drugs. family history includes Aneurysm in his father; Cancer in his maternal aunt, maternal uncle, and sister; Diabetes in his mother; Heart disease (age of onset: 67) in his mother; Hyperlipidemia in his father; Hypertension in his father. No Known Allergies   Review of Systems  Constitutional: Negative for appetite change and unexpected weight change.  Endocrine: Negative for polydipsia and polyuria.  Genitourinary: Negative for dysuria.       Objective:   Physical Exam  Constitutional: He appears well-developed and well-nourished.  Cardiovascular: Normal rate and regular rhythm.   Pulmonary/Chest: Effort normal and  breath sounds normal. No respiratory distress. He has no wheezes. He has no rales.  Musculoskeletal: He exhibits no edema.       Assessment:     Type 2 diabetes. Recent poor control. Recent addition of SGLT 2 class    Plan:     -Repeat hemoglobin A1c-Improved at 6.3% -We recommended switch to Jardiance 10 mg once daily given recent cardiovascular data -Routine follow-up in 4 months  [name] MD [facility]

## 2016-09-07 ENCOUNTER — Encounter: Payer: Self-pay | Admitting: Family Medicine

## 2016-09-07 ENCOUNTER — Ambulatory Visit (INDEPENDENT_AMBULATORY_CARE_PROVIDER_SITE_OTHER): Payer: BLUE CROSS/BLUE SHIELD | Admitting: Family Medicine

## 2016-09-07 VITALS — BP 100/70 | HR 99 | Temp 98.3°F | Ht 69.0 in | Wt 219.5 lb

## 2016-09-07 DIAGNOSIS — R1032 Left lower quadrant pain: Secondary | ICD-10-CM

## 2016-09-07 DIAGNOSIS — R1031 Right lower quadrant pain: Secondary | ICD-10-CM | POA: Diagnosis not present

## 2016-09-07 DIAGNOSIS — R350 Frequency of micturition: Secondary | ICD-10-CM

## 2016-09-07 LAB — POCT URINALYSIS DIPSTICK
BILIRUBIN UA: NEGATIVE
Ketones, UA: NEGATIVE
Leukocytes, UA: NEGATIVE
NITRITE UA: NEGATIVE
Protein, UA: NEGATIVE
RBC UA: NEGATIVE
Spec Grav, UA: 1.01
Urobilinogen, UA: 0.2
pH, UA: 5.5

## 2016-09-07 NOTE — Patient Instructions (Signed)
Abdominal Pain, Adult Abdominal pain can be caused by many things. Often, abdominal pain is not serious and it gets better with no treatment or by being treated at home. However, sometimes abdominal pain is serious. Your health care provider will do a medical history and a physical exam to try to determine the cause of your abdominal pain. Follow these instructions at home:  Take over-the-counter and prescription medicines only as told by your health care provider. Do not take a laxative unless told by your health care provider.  Drink enough fluid to keep your urine clear or pale yellow.  Watch your condition for any changes.  Keep all follow-up visits as told by your health care provider. This is important. Contact a health care provider if:  Your abdominal pain changes or gets worse.  You are not hungry or you lose weight without trying.  You are constipated or have diarrhea for more than 2-3 days.  You have pain when you urinate or have a bowel movement.  Your abdominal pain wakes you up at night.  Your pain gets worse with meals, after eating, or with certain foods.  You are throwing up and cannot keep anything down.  You have a fever. Get help right away if:  Your pain does not go away as soon as your health care provider told you to expect.  You cannot stop throwing up.  Your pain is only in areas of the abdomen, such as the right side or the left lower portion of the abdomen.  You have bloody or black stools, or stools that look like tar.  You have severe pain, cramping, or bloating in your abdomen.  You have signs of dehydration, such as:  Dark urine, very little urine, or no urine.  Cracked lips.  Dry mouth.  Sunken eyes.  Sleepiness.  Weakness. This information is not intended to replace advice given to you by your health care provider. Make sure you discuss any questions you have with your health care provider. Document Released: 05/23/2005 Document  Revised: 03/02/2016 Document Reviewed: 01/25/2016 Elsevier Interactive Patient Education  2017 [place].  Follow up for any fever, bloody stools, vomiting, or recurrent abdominal pain.

## 2016-09-07 NOTE — Progress Notes (Signed)
Pre visit review using our clinic review tool, if applicable. No additional management support is needed unless otherwise documented below in the visit note. 

## 2016-09-07 NOTE — Progress Notes (Signed)
Subjective:     Patient ID: [name], male   DOB: 1960-11-21, 56 y.o.   MRN: [id number]  HPI Patient seen with concern for possible UTI. He was recently started on Jardiance. His symptoms are that last Saturday he developed some right and left lower quadrant abdominal pains which he described as "sharp". Right-sided pain seem to be worse. These were somewhat improved by Sunday and much improved by Monday. He has not had any burning with urination. No flank pain. No fevers or chills. He was concerned because of the symptoms above whether he may have UTI. Denies any recent change in bowel habits. No nausea or vomiting. No fever. He had colonoscopy 2012 with reported benign polyps  Past Medical History:  Diagnosis Date  . Diabetes mellitus without complication ([place]) [id number]   type 2  . Hypertension   . Prostate cancer [facility])    Past Surgical History:  Procedure Laterality Date  . LYMPHADENECTOMY Bilateral 08/06/2014   Procedure: PELVIC LYMPH NODE DISSECTION;  Surgeon: Junious Dresser, MD;  [facility];  Service: Urology;  Laterality: Bilateral;  . PROSTATE BIOPSY    . ROBOT ASSISTED LAPAROSCOPIC RADICAL PROSTATECTOMY N/A 08/06/2014   Procedure: ROBOTIC ASSISTED LAPAROSCOPIC RADICAL PROSTATECTOMY WITH INDOCYANINE GREEN DYE;  Surgeon: Junious Dresser, MD;  [facility];  Service: Urology;  Laterality: N/A;  . TONSILLECTOMY      reports that he quit smoking about 32 years ago. His smoking use included Cigarettes. He has a 10.00 pack-year smoking history. His smokeless tobacco use includes Chew. He reports that he drinks alcohol. He reports that he does not use drugs. family history includes Aneurysm in his father; Cancer in his maternal aunt, maternal uncle, and sister; Diabetes in his mother; Heart disease (age of onset: 13) in his mother; Hyperlipidemia in his father; Hypertension in his father. No Known Allergies   Review of Systems  Respiratory: Negative for shortness of  breath.   Gastrointestinal: Negative for abdominal distention, blood in stool, diarrhea, nausea and vomiting.  Genitourinary: Negative for difficulty urinating, dysuria, flank pain and hematuria.       Objective:   Physical Exam  Constitutional: He appears well-developed and well-nourished.  Cardiovascular: Normal rate and regular rhythm.   Pulmonary/Chest: Effort normal and breath sounds normal. No respiratory distress. He has no wheezes. He has no rales.  Abdominal: Soft. Bowel sounds are normal. He exhibits no distension and no mass. There is no tenderness. There is no rebound and no guarding.       Assessment:     #1 mild urine frequency probably related to SGLT 2- medication. No evidence for infection on urine dipstick  #2 bilateral lower quadrant abdominal pain improving and almost resolved-? Gaseous. He's not had any fever or other indicators of infection and benign exam at this time    Plan:     -Continue with Jardiance -Follow-up promptly for any fever, vomiting, bloody stool, or recurrent abdominal pain  [name] MD  Primary Care at [facility]

## 2016-12-07 ENCOUNTER — Encounter: Payer: Self-pay | Admitting: Family Medicine

## 2016-12-07 ENCOUNTER — Ambulatory Visit (INDEPENDENT_AMBULATORY_CARE_PROVIDER_SITE_OTHER): Payer: BLUE CROSS/BLUE SHIELD | Admitting: Family Medicine

## 2016-12-07 VITALS — BP 120/88 | HR 82 | Temp 98.5°F | Wt 220.6 lb

## 2016-12-07 DIAGNOSIS — E785 Hyperlipidemia, unspecified: Secondary | ICD-10-CM

## 2016-12-07 DIAGNOSIS — R490 Dysphonia: Secondary | ICD-10-CM

## 2016-12-07 DIAGNOSIS — E1165 Type 2 diabetes mellitus with hyperglycemia: Secondary | ICD-10-CM | POA: Diagnosis not present

## 2016-12-07 DIAGNOSIS — IMO0001 Reserved for inherently not codable concepts without codable children: Secondary | ICD-10-CM

## 2016-12-07 DIAGNOSIS — I10 Essential (primary) hypertension: Secondary | ICD-10-CM

## 2016-12-07 LAB — POCT GLYCOSYLATED HEMOGLOBIN (HGB A1C): HEMOGLOBIN A1C: 6.8

## 2016-12-07 NOTE — Progress Notes (Signed)
Pre visit review using our clinic review tool, if applicable. No additional management support is needed unless otherwise documented below in the visit note. 

## 2016-12-07 NOTE — Patient Instructions (Signed)
Try to lose some weight Let's plan on 3 month follow up .

## 2016-12-07 NOTE — Progress Notes (Signed)
Subjective:     Patient ID: [name], male   DOB: Nov 03, 1960, 56 y.o.   MRN: [id number]  HPI Patient seen for medical follow-up  Type 2 diabetes. Last A1c 6.3%. His fasting blood sugars have been fairly consistently up but postprandials fairly well controlled. No polyuria or polydipsia. We had recently added Jardiance.  Denies any adverse side effects.  Hypertension treated with amlodipine. Blood pressure stable by home readings. No dizziness. No headaches. No recent chest pains.  Dyslipidemia. He's had previous triglycerides over 1800 and is currently on TriCor. Most recent triglycerides were less than 400. He is diligently trying to reduce sugars and starches. Still eats lots of potatoes.  Patient complains of some hoarseness for the past week or so. He had what sounds like viral URI with cough last week. No fever this point. Cough mostly nonproductive. He is next smoker. No hemoptysis. No appetite or weight changes. No sore throat.  Past Medical History:  Diagnosis Date  . Diabetes mellitus without complication ([place]) 1/32   type 2  . Hypertension   . Prostate cancer [facility])    Past Surgical History:  Procedure Laterality Date  . LYMPHADENECTOMY Bilateral 08/06/2014   Procedure: PELVIC LYMPH NODE DISSECTION;  Surgeon: Junious Dresser, MD;  [facility];  Service: Urology;  Laterality: Bilateral;  . PROSTATE BIOPSY    . ROBOT ASSISTED LAPAROSCOPIC RADICAL PROSTATECTOMY N/A 08/06/2014   Procedure: ROBOTIC ASSISTED LAPAROSCOPIC RADICAL PROSTATECTOMY WITH INDOCYANINE GREEN DYE;  Surgeon: Junious Dresser, MD;  [facility];  Service: Urology;  Laterality: N/A;  . TONSILLECTOMY      reports that he quit smoking about 32 years ago. His smoking use included Cigarettes. He has a 10.00 pack-year smoking history. His smokeless tobacco use includes Chew. He reports that he drinks alcohol. He reports that he does not use drugs. family history includes Aneurysm in his father;  Cancer in his maternal aunt, maternal uncle, and sister; Diabetes in his mother; Heart disease (age of onset: 62) in his mother; Hyperlipidemia in his father; Hypertension in his father. No Known Allergies   Review of Systems  Constitutional: Negative for chills, fatigue, fever and unexpected weight change.  HENT: Positive for voice change. Negative for sore throat.   Eyes: Negative for visual disturbance.  Respiratory: Positive for cough. Negative for chest tightness and shortness of breath.   Cardiovascular: Negative for chest pain, palpitations and leg swelling.  Endocrine: Negative for polydipsia and polyuria.  Genitourinary: Negative for dysuria.  Neurological: Negative for dizziness, syncope, weakness, light-headedness and headaches.       Objective:   Physical Exam  Constitutional: He is oriented to person, place, and time. He appears well-developed and well-nourished.  HENT:  Right Ear: External ear normal.  Left Ear: External ear normal.  Mouth/Throat: Oropharynx is clear and moist. No oropharyngeal exudate.  Eyes: Pupils are equal, round, and reactive to light.  Neck: Neck supple. No thyromegaly present.  Cardiovascular: Normal rate and regular rhythm.   Pulmonary/Chest: Effort normal and breath sounds normal. No respiratory distress. He has no wheezes. He has no rales.  Musculoskeletal: He exhibits no edema.  Lymphadenopathy:    He has no cervical adenopathy.  Neurological: He is alert and oriented to person, place, and time.       Assessment:     #1 type 2 diabetes. Repeat A1c today 6.8%  #2 hypertension stable and at goal  #3 dyslipidemia with extremely high triglycerides  #4 hoarseness probably  related to recent viral URI. He denies any allergic postnasal drip symptoms    Plan:     -Continue current medications -Strongly advised to lose some weight and reduce sugars and starches -Routine follow-up in 3 months and repeat A1c then -We discussed statin use  in diabetics. His previous LDLs have been very well controlled. He is reluctant start at this time -Be in touch if hoarseness not completely resolved within 2 weeks. If not resolving that time consider ENT referral  [name] MD [facility]

## 2017-01-30 ENCOUNTER — Encounter: Payer: Self-pay | Admitting: Family Medicine

## 2017-02-06 ENCOUNTER — Ambulatory Visit: Payer: BLUE CROSS/BLUE SHIELD | Admitting: Family Medicine

## 2017-04-09 ENCOUNTER — Other Ambulatory Visit: Payer: Self-pay | Admitting: Family Medicine

## 2017-05-13 ENCOUNTER — Other Ambulatory Visit: Payer: Self-pay | Admitting: Family Medicine

## 2017-07-12 ENCOUNTER — Other Ambulatory Visit: Payer: Self-pay | Admitting: Family Medicine

## 2017-07-15 ENCOUNTER — Telehealth: Payer: Self-pay | Admitting: *Deleted

## 2017-07-15 NOTE — Telephone Encounter (Signed)
[facility] faxed a note stating Jardiance 10mg  requires a prior auth.  I entered info through [email or web address] (key#[id number]) and a note stated to call [facility] as this may not be covered by his plan.  I called [facility] and spoke with Randall Hiss and he stated the pt has multiple accts, later spoke with [place] and he stated the pts medication will be sent to his home.
